# Patient Record
Sex: Female | Born: 1950 | Race: White | Hispanic: No | Marital: Married | Smoking: Never smoker
Health system: Southern US, Community
[De-identification: ages and names within clinical notes are randomized; demographics above are authoritative.]

## PROBLEM LIST (undated history)

## (undated) DIAGNOSIS — G43909 Migraine, unspecified, not intractable, without status migrainosus: Secondary | ICD-10-CM

## (undated) DIAGNOSIS — D329 Benign neoplasm of meninges, unspecified: Secondary | ICD-10-CM

## (undated) DIAGNOSIS — M5090 Cervical disc disorder, unspecified, unspecified cervical region: Secondary | ICD-10-CM

## (undated) DIAGNOSIS — I2510 Atherosclerotic heart disease of native coronary artery without angina pectoris: Secondary | ICD-10-CM

## (undated) DIAGNOSIS — M2242 Chondromalacia patellae, left knee: Secondary | ICD-10-CM

## (undated) DIAGNOSIS — M47816 Spondylosis without myelopathy or radiculopathy, lumbar region: Secondary | ICD-10-CM

## (undated) DIAGNOSIS — K219 Gastro-esophageal reflux disease without esophagitis: Secondary | ICD-10-CM

## (undated) DIAGNOSIS — G4700 Insomnia, unspecified: Secondary | ICD-10-CM

## (undated) DIAGNOSIS — R42 Dizziness and giddiness: Secondary | ICD-10-CM

## (undated) DIAGNOSIS — N189 Chronic kidney disease, unspecified: Secondary | ICD-10-CM

## (undated) DIAGNOSIS — N8110 Cystocele, unspecified: Secondary | ICD-10-CM

## (undated) DIAGNOSIS — B019 Varicella without complication: Secondary | ICD-10-CM

## (undated) DIAGNOSIS — I700 Atherosclerosis of aorta: Secondary | ICD-10-CM

## (undated) DIAGNOSIS — K649 Unspecified hemorrhoids: Secondary | ICD-10-CM

## (undated) DIAGNOSIS — R51 Headache: Secondary | ICD-10-CM

## (undated) DIAGNOSIS — M7580 Other shoulder lesions, unspecified shoulder: Secondary | ICD-10-CM

## (undated) DIAGNOSIS — M519 Unspecified thoracic, thoracolumbar and lumbosacral intervertebral disc disorder: Secondary | ICD-10-CM

## (undated) DIAGNOSIS — R519 Headache, unspecified: Secondary | ICD-10-CM

## (undated) HISTORY — PX: SALPINGECTOMY: SHX328

## (undated) HISTORY — PX: SINUS EXPLORATION: SHX5214

## (undated) HISTORY — PX: ESOPHAGOGASTRODUODENOSCOPY: SHX1529

## (undated) HISTORY — PX: ABDOMINAL HYSTERECTOMY: SHX81

## (undated) HISTORY — PX: OTHER SURGICAL HISTORY: SHX169

## (undated) HISTORY — PX: TUBAL LIGATION: SHX77

---

## 2007-12-19 DIAGNOSIS — D239 Other benign neoplasm of skin, unspecified: Secondary | ICD-10-CM

## 2007-12-19 HISTORY — DX: Other benign neoplasm of skin, unspecified: D23.9

## 2010-04-22 ENCOUNTER — Ambulatory Visit: Payer: Self-pay | Admitting: Otolaryngology

## 2016-07-14 DIAGNOSIS — R519 Headache, unspecified: Secondary | ICD-10-CM | POA: Insufficient documentation

## 2016-07-15 DIAGNOSIS — G43719 Chronic migraine without aura, intractable, without status migrainosus: Secondary | ICD-10-CM | POA: Insufficient documentation

## 2016-07-21 DIAGNOSIS — G4700 Insomnia, unspecified: Secondary | ICD-10-CM | POA: Insufficient documentation

## 2016-07-22 ENCOUNTER — Other Ambulatory Visit: Payer: Self-pay | Admitting: Internal Medicine

## 2016-07-22 DIAGNOSIS — R1084 Generalized abdominal pain: Secondary | ICD-10-CM | POA: Insufficient documentation

## 2016-08-04 ENCOUNTER — Ambulatory Visit
Admission: RE | Admit: 2016-08-04 | Discharge: 2016-08-04 | Disposition: A | Discharge status: Home / Self Care | Payer: BLUE CROSS/BLUE SHIELD | Source: Ambulatory Visit | Attending: Internal Medicine | Admitting: Internal Medicine

## 2016-08-04 DIAGNOSIS — R1084 Generalized abdominal pain: Secondary | ICD-10-CM | POA: Insufficient documentation

## 2016-08-04 DIAGNOSIS — I700 Atherosclerosis of aorta: Secondary | ICD-10-CM | POA: Diagnosis not present

## 2016-08-04 LAB — POCT I-STAT CREATININE: Creatinine, Ser: 1.1 mg/dL — ABNORMAL HIGH (ref 0.44–1.00)

## 2016-08-04 MED ORDER — IOPAMIDOL (ISOVUE-300) INJECTION 61%
100.0000 mL | Freq: Once | INTRAVENOUS | Status: AC | PRN
  Administered 2016-08-04: 100 mL via INTRAVENOUS

## 2016-08-17 ENCOUNTER — Other Ambulatory Visit: Payer: Self-pay | Admitting: Physician Assistant

## 2016-08-17 DIAGNOSIS — M5412 Radiculopathy, cervical region: Secondary | ICD-10-CM

## 2016-12-01 ENCOUNTER — Other Ambulatory Visit: Payer: Self-pay | Admitting: Obstetrics & Gynecology

## 2016-12-01 DIAGNOSIS — Z1231 Encounter for screening mammogram for malignant neoplasm of breast: Secondary | ICD-10-CM

## 2016-12-08 ENCOUNTER — Ambulatory Visit
Admission: RE | Admit: 2016-12-08 | Discharge: 2016-12-08 | Disposition: A | Discharge status: Home / Self Care | Payer: BLUE CROSS/BLUE SHIELD | Source: Ambulatory Visit | Attending: Physician Assistant | Admitting: Physician Assistant

## 2016-12-08 DIAGNOSIS — M50323 Other cervical disc degeneration at C6-C7 level: Secondary | ICD-10-CM | POA: Diagnosis not present

## 2016-12-08 DIAGNOSIS — M4802 Spinal stenosis, cervical region: Secondary | ICD-10-CM | POA: Insufficient documentation

## 2016-12-08 DIAGNOSIS — M5412 Radiculopathy, cervical region: Secondary | ICD-10-CM | POA: Diagnosis present

## 2016-12-13 ENCOUNTER — Encounter: Payer: Self-pay | Admitting: Radiology

## 2016-12-13 ENCOUNTER — Ambulatory Visit
Admission: RE | Admit: 2016-12-13 | Discharge: 2016-12-13 | Disposition: A | Discharge status: Home / Self Care | Payer: BLUE CROSS/BLUE SHIELD | Source: Ambulatory Visit | Attending: Obstetrics & Gynecology | Admitting: Obstetrics & Gynecology

## 2016-12-13 DIAGNOSIS — Z1231 Encounter for screening mammogram for malignant neoplasm of breast: Secondary | ICD-10-CM

## 2016-12-22 ENCOUNTER — Ambulatory Visit: Payer: BLUE CROSS/BLUE SHIELD | Admitting: Anesthesiology

## 2016-12-22 ENCOUNTER — Ambulatory Visit
Admission: RE | Admit: 2016-12-22 | Discharge: 2016-12-22 | Disposition: A | Discharge status: Home / Self Care | Payer: BLUE CROSS/BLUE SHIELD | Source: Ambulatory Visit | Attending: Internal Medicine | Admitting: Internal Medicine

## 2016-12-22 ENCOUNTER — Inpatient Hospital Stay
Admission: RE | Admit: 2016-12-22 | Discharge: 2016-12-22 | Disposition: A | Discharge status: Home / Self Care | Payer: Self-pay | Source: Ambulatory Visit | Attending: *Deleted | Admitting: *Deleted

## 2016-12-22 ENCOUNTER — Encounter: Payer: Self-pay | Admitting: Anesthesiology

## 2016-12-22 ENCOUNTER — Other Ambulatory Visit: Payer: Self-pay | Admitting: *Deleted

## 2016-12-22 ENCOUNTER — Encounter
Admission: RE | Disposition: A | Discharge status: Home / Self Care | Payer: Self-pay | Source: Ambulatory Visit | Attending: Internal Medicine

## 2016-12-22 DIAGNOSIS — Z79899 Other long term (current) drug therapy: Secondary | ICD-10-CM | POA: Diagnosis not present

## 2016-12-22 DIAGNOSIS — Z1211 Encounter for screening for malignant neoplasm of colon: Secondary | ICD-10-CM | POA: Diagnosis present

## 2016-12-22 DIAGNOSIS — Z9289 Personal history of other medical treatment: Secondary | ICD-10-CM

## 2016-12-22 DIAGNOSIS — K5900 Constipation, unspecified: Secondary | ICD-10-CM | POA: Insufficient documentation

## 2016-12-22 DIAGNOSIS — K219 Gastro-esophageal reflux disease without esophagitis: Secondary | ICD-10-CM | POA: Insufficient documentation

## 2016-12-22 DIAGNOSIS — Z888 Allergy status to other drugs, medicaments and biological substances status: Secondary | ICD-10-CM | POA: Insufficient documentation

## 2016-12-22 HISTORY — DX: Migraine, unspecified, not intractable, without status migrainosus: G43.909

## 2016-12-22 HISTORY — DX: Headache: R51

## 2016-12-22 HISTORY — PX: COLONOSCOPY WITH PROPOFOL: SHX5780

## 2016-12-22 HISTORY — DX: Headache, unspecified: R51.9

## 2016-12-22 HISTORY — DX: Cystocele, unspecified: N81.10

## 2016-12-22 SURGERY — COLONOSCOPY WITH PROPOFOL
Anesthesia: General

## 2016-12-22 MED ORDER — PROPOFOL 10 MG/ML IV BOLUS
INTRAVENOUS | Status: DC | PRN
  Administered 2016-12-22: 30 mg via INTRAVENOUS
  Administered 2016-12-22: 50 mg via INTRAVENOUS
  Administered 2016-12-22: 20 mg via INTRAVENOUS

## 2016-12-22 MED ORDER — PROPOFOL 500 MG/50ML IV EMUL
INTRAVENOUS | Status: DC | PRN
  Administered 2016-12-22: 150 ug/kg/min via INTRAVENOUS

## 2016-12-22 MED ORDER — LIDOCAINE HCL (PF) 2 % IJ SOLN
INTRAMUSCULAR | Status: AC
  Filled 2016-12-22: qty 10

## 2016-12-22 MED ORDER — SODIUM CHLORIDE 0.9 % IV SOLN
INTRAVENOUS | Status: DC
  Administered 2016-12-22: 1000 mL via INTRAVENOUS

## 2016-12-22 MED ORDER — GLYCOPYRROLATE 0.2 MG/ML IJ SOLN
INTRAMUSCULAR | Status: AC
Start: 2016-12-22 — End: 2016-12-22
  Filled 2016-12-22: qty 1

## 2016-12-22 MED ORDER — PROPOFOL 500 MG/50ML IV EMUL
INTRAVENOUS | Status: AC
  Filled 2016-12-22: qty 50

## 2016-12-22 MED ORDER — PROPOFOL 10 MG/ML IV BOLUS
INTRAVENOUS | Status: AC
Start: 2016-12-22 — End: 2016-12-22
  Filled 2016-12-22: qty 20

## 2016-12-22 MED ORDER — LIDOCAINE HCL (CARDIAC) 20 MG/ML IV SOLN
INTRAVENOUS | Status: DC | PRN
  Administered 2016-12-22: 50 mg via INTRAVENOUS

## 2016-12-22 NOTE — Anesthesia Postprocedure Evaluation (Signed)
Anesthesia Post Note  Patient: Selena Lesser  Procedure(s) Performed: COLONOSCOPY WITH PROPOFOL (N/A )  Patient location during evaluation: Endoscopy Anesthesia Type: General Level of consciousness: awake and alert and oriented Pain management: pain level controlled Vital Signs Assessment: post-procedure vital signs reviewed and stable Respiratory status: spontaneous breathing, nonlabored ventilation and respiratory function stable Cardiovascular status: blood pressure returned to baseline and stable Postop Assessment: no signs of nausea or vomiting Anesthetic complications: no     Last Vitals:  Vitals:   12/22/16 1410 12/22/16 1419  BP: 106/63 108/71  Pulse: 70 72  Resp: (!) 22 18  Temp:    SpO2: 99% 98%    Last Pain:  Vitals:   12/22/16 1350  TempSrc: Tympanic  PainSc: 0-No pain                 Rilynn Habel

## 2016-12-22 NOTE — Op Note (Signed)
[facility] Gastroenterology Patient Name: Michelle Warren Procedure Date: 12/22/2016 1:16 PM MRN: [id number] Account #: [id number] Date of Birth: 1951-03-17 Admit Type: Outpatient Age: 66 Room: [facility] ENDO ROOM 1 Gender: Female Note Status: Finalized Procedure:            Colonoscopy Indications:          Screening for colorectal malignant neoplasm Providers:            Benay Pike. Alice Reichert MD, MD Referring MD:         Ramonita Lab, MD (Referring MD) Medicines:            Propofol per Anesthesia Complications:        No immediate complications. Procedure:            Pre-Anesthesia Assessment:                       - ASA Grade Assessment: III - A patient with severe                        systemic disease.                       - After reviewing the risks and benefits, the patient                        was deemed in satisfactory condition to undergo the                        procedure.                       After obtaining informed consent, the colonoscope was                        passed under direct vision. Throughout the procedure,                        the patient's blood pressure, pulse, and oxygen                        saturations were monitored continuously. The                        Colonoscope was introduced through the anus and                        advanced to the the cecum, identified by appendiceal                        orifice and ileocecal valve. The quality of the bowel                        preparation was fair. The ileocecal valve, appendiceal                        orifice, and rectum were photographed. Findings:      The perianal and digital rectal examinations were normal.      A moderate amount of semi-liquid stool was found in the sigmoid colon,       making visualization difficult. Lavage of the area was performed using  50 - 200 mL of tap water, resulting in clearance with fair visualization.      The exam was otherwise without  abnormality. Impression:           - Preparation of the colon was fair.                       - Stool in the sigmoid colon.                       - The examination was otherwise normal.                       - No specimens collected. Recommendation:       - Patient has a contact number available for                        emergencies. The signs and symptoms of potential                        delayed complications were discussed with the patient.                        Return to normal activities tomorrow. Written discharge                        instructions were provided to the patient.                       - Resume previous diet.                       - Continue present medications.                       - Repeat colonoscopy in 5 years for screening purposes.                       - Return to GI clinic as previously scheduled.                       - Repeat colonoscopy.                       - The findings and recommendations were discussed with                        the patient.                       - The findings and recommendations were discussed with                        the patient's family. Procedure Code(s):    --- Professional ---                       J5701, Colorectal cancer screening; colonoscopy on                        individual not meeting criteria for high risk Diagnosis Code(s):    --- Professional ---  Z12.11, Encounter for screening for malignant neoplasm                        of colon CPT copyright 2016 American Medical Association. All rights reserved. The codes documented in this report are preliminary and upon coder review may  be revised to meet current compliance requirements. Efrain Sella MD, MD 12/22/2016 1:49:25 PM This report has been signed electronically. Number of Addenda: 0 Note Initiated On: 12/22/2016 1:16 PM Scope Withdrawal Time: 0 hours 7 minutes 56 seconds  Total Procedure Duration: 0 hours 18 minutes 34 seconds        [facility]

## 2016-12-22 NOTE — Transfer of Care (Signed)
Immediate Anesthesia Transfer of Care Note  Patient: Michelle Warren  Procedure(s) Performed: COLONOSCOPY WITH PROPOFOL (N/A )  Patient Location: PACU  Anesthesia Type:General  Level of Consciousness: awake, alert , oriented and patient cooperative  Airway & Oxygen Therapy: Patient Spontanous Breathing and Patient connected to nasal cannula oxygen  Post-op Assessment: Report given to RN and Post -op Vital signs reviewed and stable  Post vital signs: Reviewed and stable  Last Vitals:  Vitals:   12/22/16 1238 12/22/16 1350  BP: 94/69 95/60  Pulse: 74 74  Resp: 16 16  Temp: (!) 36.1 C (!) 36.2 C  SpO2: 98% 100%    Last Pain:  Vitals:   12/22/16 1350  TempSrc: Tympanic  PainSc: 0-No pain         Complications: No apparent anesthesia complications

## 2016-12-22 NOTE — Anesthesia Preprocedure Evaluation (Signed)
Anesthesia Evaluation  Patient identified by MRN, date of birth, ID band Patient awake    Reviewed: Allergy & Precautions, H&P , NPO status , Patient's Chart, lab work & pertinent test results  History of Anesthesia Complications Negative for: history of anesthetic complications  Airway Mallampati: II  TM Distance: >3 FB Neck ROM: full    Dental  (+) Poor Dentition, Chipped   Pulmonary neg pulmonary ROS, neg shortness of breath,           Cardiovascular Exercise Tolerance: Good (-) angina(-) Past MI and (-) DOE negative cardio ROS       Neuro/Psych negative neurological ROS  negative psych ROS   GI/Hepatic negative GI ROS, Neg liver ROS, neg GERD  ,  Endo/Other  negative endocrine ROS  Renal/GU negative Renal ROS  negative genitourinary   Musculoskeletal   Abdominal   Peds  Hematology negative hematology ROS (+)   Anesthesia Other Findings Patient reports new nerve problems in her left arm and neck    Reproductive/Obstetrics negative OB ROS                             Anesthesia Physical Anesthesia Plan  ASA: II  Anesthesia Plan: General   Post-op Pain Management:    Induction: Intravenous  PONV Risk Score and Plan: Propofol infusion  Airway Management Planned: Natural Airway and Nasal Cannula  Additional Equipment:   Intra-op Plan:   Post-operative Plan:   Informed Consent: I have reviewed the patients History and Physical, chart, labs and discussed the procedure including the risks, benefits and alternatives for the proposed anesthesia with the patient or authorized representative who has indicated his/her understanding and acceptance.   Dental Advisory Given  Plan Discussed with: Anesthesiologist, CRNA and Surgeon  Anesthesia Plan Comments: (Patient consented for risks of anesthesia including but not limited to:  - adverse reactions to medications - risk of  intubation if required - damage to teeth, lips or other oral mucosa - sore throat or hoarseness - Damage to heart, brain, lungs or loss of life  Patient voiced understanding.)        Anesthesia Quick Evaluation

## 2016-12-22 NOTE — Anesthesia Procedure Notes (Signed)
Date/Time: 12/22/2016 1:17 PM Performed by: Darlyne Russian Pre-anesthesia Checklist: Patient identified, Emergency Drugs available, Suction available, Patient being monitored and Timeout performed Patient Re-evaluated:Patient Re-evaluated prior to induction Oxygen Delivery Method: Nasal cannula Placement Confirmation: positive ETCO2

## 2016-12-22 NOTE — H&P (Signed)
Outpatient short stay form Pre-procedure 12/22/2016 1:08 PM Michelle Warren [name], M.D.  Primary Physician: Ramonita Lab, M.D.  Reason for visit: Colon cancer screening  History of present illness:  66 y/o female with hx of migraine headache and chronic constipation presents for screening colonoscopy. Patient denies rectal bleeding, abnormal weight loss. She has mild bloating with constipation. She reports feeling nervous today and having a migraine headache.   No current facility-administered medications for this encounter.   Prescriptions Prior to Admission  Medication Sig Dispense Refill Last Dose  . albuterol (VENTOLIN HFA) 108 (90 Base) MCG/ACT inhaler Inhale into the lungs every 6 (six) hours as needed for wheezing or shortness of breath.     . diphenhydrAMINE (BENADRYL) 25 mg capsule Take 25 mg by mouth every 6 (six) hours as needed.     . docusate sodium (COLACE) 100 MG capsule Take 100 mg by mouth 2 (two) times daily.     Marland Kitchen eletriptan (RELPAX) 40 MG tablet Take 40 mg by mouth as needed for migraine or headache. May repeat in 2 hours if headache persists or recurs.     . famotidine (PEPCID) 20 MG tablet Take 20 mg by mouth 2 (two) times daily.     Marland Kitchen gabapentin (NEURONTIN) 800 MG tablet Take 800 mg by mouth 3 (three) times daily.     . hydrocortisone (ANUSOL-HC) 2.5 % rectal cream Place 1 application rectally 2 (two) times daily.     . Melatonin 10 MG CAPS Take by mouth.     . senna (SENOKOT) 8.6 MG tablet Take 1 tablet by mouth daily.     Marland Kitchen topiramate (TOPAMAX) 200 MG tablet Take 200 mg by mouth 2 (two) times daily.     Marland Kitchen zolpidem (AMBIEN) 5 MG tablet Take 5 mg by mouth at bedtime as needed for sleep.        Allergies  Allergen Reactions  . Macrobid [Nitrofurantoin Macrocrystal] Shortness Of Breath     Past Medical History:  Diagnosis Date  . Bladder prolapse, female, acquired   . Headache   . Migraines     Review of systems:   As in HPI. An 8 system ROS was otherwise  negative.    Physical Exam  General appearance: alert, cooperative and appears stated age Resp: clear to auscultation bilaterally Cardio: regular rate and rhythm, S1, S2 normal, no murmur, click, rub or gallop GI: soft, non-tender; bowel sounds normal; no masses,  no organomegaly     Planned procedures: Proceed with colonoscopy. The patient understands the nature of the planned procedure, indications, risks, alternatives and potential complications including but not limited to bleeding, infection, perforation, damage to internal organs and possible oversedation/side effects from anesthesia. The patient agrees and gives consent to proceed.  Please refer to procedure notes for findings, recommendations and patient disposition/instructions.    [name] [name], M.D. Gastroenterology 12/22/2016  1:08 PM

## 2016-12-22 NOTE — Anesthesia Post-op Follow-up Note (Signed)
Anesthesia QCDR form completed.        

## 2016-12-22 NOTE — Interval H&P Note (Signed)
History and Physical Interval Note:  12/22/2016 1:11 PM  Michelle Warren  has presented today for surgery, with the diagnosis of SCREENING  The various methods of treatment have been discussed with the patient and family. After consideration of risks, benefits and other options for treatment, the patient has consented to  Procedure(s): COLONOSCOPY WITH PROPOFOL (N/A) as a surgical intervention .  The patient's history has been reviewed, patient examined, no change in status, stable for surgery.  I have reviewed the patient's chart and labs.  Questions were answered to the patient's satisfaction.     [place], [place]

## 2016-12-23 ENCOUNTER — Encounter: Payer: Self-pay | Admitting: Internal Medicine

## 2017-02-08 ENCOUNTER — Encounter: Payer: Self-pay | Admitting: Emergency Medicine

## 2017-02-08 ENCOUNTER — Emergency Department
Admission: EM | Admit: 2017-02-08 | Discharge: 2017-02-08 | Disposition: A | Discharge status: Home / Self Care | Payer: BLUE CROSS/BLUE SHIELD | Attending: Emergency Medicine | Admitting: Emergency Medicine

## 2017-02-08 DIAGNOSIS — R51 Headache: Secondary | ICD-10-CM

## 2017-02-08 DIAGNOSIS — G44219 Episodic tension-type headache, not intractable: Secondary | ICD-10-CM | POA: Insufficient documentation

## 2017-02-08 DIAGNOSIS — Z79899 Other long term (current) drug therapy: Secondary | ICD-10-CM | POA: Insufficient documentation

## 2017-02-08 DIAGNOSIS — R519 Headache, unspecified: Secondary | ICD-10-CM

## 2017-02-08 MED ORDER — KETOROLAC TROMETHAMINE 30 MG/ML IJ SOLN
30.0000 mg | Freq: Once | INTRAMUSCULAR | Status: DC
  Filled 2017-02-08: qty 1

## 2017-02-08 MED ORDER — ORPHENADRINE CITRATE 30 MG/ML IJ SOLN
60.0000 mg | INTRAMUSCULAR | Status: AC
  Administered 2017-02-08: 60 mg via INTRAMUSCULAR
  Filled 2017-02-08: qty 2

## 2017-02-08 MED ORDER — KETOROLAC TROMETHAMINE 30 MG/ML IJ SOLN
30.0000 mg | Freq: Once | INTRAMUSCULAR | Status: AC
  Administered 2017-02-08: 30 mg via INTRAMUSCULAR

## 2017-02-08 MED ORDER — KETOROLAC TROMETHAMINE 10 MG PO TABS: 10.0000 mg | ORAL_TABLET | Freq: Three times a day (TID) | ORAL | 0 refills | Status: DC

## 2017-02-08 MED ORDER — PROCHLORPERAZINE MALEATE 10 MG PO TABS: 10.0000 mg | ORAL_TABLET | Freq: Three times a day (TID) | ORAL | 0 refills | Status: DC | PRN

## 2017-02-08 MED ORDER — PROCHLORPERAZINE MALEATE 10 MG PO TABS
10.0000 mg | ORAL_TABLET | Freq: Once | ORAL | Status: AC
  Administered 2017-02-08: 10 mg via ORAL
  Filled 2017-02-08: qty 1

## 2017-02-08 MED ORDER — ORPHENADRINE CITRATE ER 100 MG PO TB12: 100.0000 mg | ORAL_TABLET | Freq: Two times a day (BID) | ORAL | 0 refills | Status: DC | PRN

## 2017-02-08 NOTE — ED Provider Notes (Signed)
[facility] Emergency Department Provider Note ____________________________________________  Time seen: 1907  I have reviewed the triage vital signs and the nursing notes.  HISTORY  Chief Complaint  Headache  HPI Michelle Warren is a 66 y.o. female presents to the ED for evaluation of migraine headache.  Patient presents from home with a history of migraines.  She did denies any vomiting but does report some nausea.  She is typically treated with topiramate, eletriptan, and diphenhydramine for migraine relief. She gives a history of cervical spinal stenosis and DDD. She has been seen by [facility] with Botox injections. She reports this type of posterior headache is not her typical migraine. She does note, however, it is not new, different, or concerning. She just was not able to get pain relief at home. She has in the past been treated with trigger point injections to the occiput. She denies any visual change, weakness, syncope, chest pain, or distal paresthesias. She has dosed her daily migraine medications, without relief. She denies injury or trauma.   Past Medical History:  Diagnosis Date  . Bladder prolapse, female, acquired   . Headache   . Migraines     There are no active problems to display for this patient.   Past Surgical History:  Procedure Laterality Date  . ABDOMINAL HYSTERECTOMY    . bladder tack    . COLONOSCOPY WITH PROPOFOL N/A 12/22/2016   Procedure: COLONOSCOPY WITH PROPOFOL;  Surgeon: [place], Benay Pike, MD;  Location: [facility] ENDOSCOPY;  Service: Gastroenterology;  Laterality: N/A;  . SINUS EXPLORATION    . TUBAL LIGATION      Prior to Admission medications   Medication Sig Start Date End Date Taking? Authorizing Provider  albuterol (VENTOLIN HFA) 108 (90 Base) MCG/ACT inhaler Inhale into the lungs every 6 (six) hours as needed for wheezing or shortness of breath.    [provider]  diphenhydrAMINE (BENADRYL) 25 mg capsule Take  25 mg by mouth every 6 (six) hours as needed.    [provider]  docusate sodium (COLACE) 100 MG capsule Take 100 mg by mouth 2 (two) times daily.    [provider]  eletriptan (RELPAX) 40 MG tablet Take 40 mg by mouth as needed for migraine or headache. May repeat in 2 hours if headache persists or recurs.    [provider]  famotidine (PEPCID) 20 MG tablet Take 20 mg by mouth 2 (two) times daily.    [provider]  gabapentin (NEURONTIN) 800 MG tablet Take 800 mg by mouth 3 (three) times daily.    [provider]  hydrocortisone (ANUSOL-HC) 2.5 % rectal cream Place 1 application rectally 2 (two) times daily.    [provider]  ketorolac (TORADOL) 10 MG tablet Take 1 tablet (10 mg total) by mouth every 8 (eight) hours. 02/08/17   Guneet Delpino, Dannielle Karvonen, PA-C  Melatonin 10 MG CAPS Take by mouth.    [provider]  orphenadrine (NORFLEX) 100 MG tablet Take 1 tablet (100 mg total) by mouth 2 (two) times daily as needed for muscle spasms. 02/08/17   Gabriele Loveland, Dannielle Karvonen, PA-C  prochlorperazine (COMPAZINE) 10 MG tablet Take 1 tablet (10 mg total) by mouth every 8 (eight) hours as needed for nausea or vomiting. 02/08/17   Kemisha Bonnette, Dannielle Karvonen, PA-C  senna (SENOKOT) 8.6 MG tablet Take 1 tablet by mouth daily.    [provider]  topiramate (TOPAMAX) 200 MG tablet Take 200 mg by mouth 2 (  two) times daily.    [provider]  zolpidem (AMBIEN) 5 MG tablet Take 5 mg by mouth at bedtime as needed for sleep.    [provider]    Allergies Macrobid [nitrofurantoin macrocrystal]  Family History  Problem Relation Age of Onset  . Breast cancer Neg Hx     Social History Social History   Tobacco Use  . Smoking status: Never Smoker  . Smokeless tobacco: Never Used  Substance Use Topics  . Alcohol use: No    Frequency: Never  . Drug use: No    Review of Systems  Constitutional: Negative for  fever. Eyes: Negative for visual changes. ENT: Negative for sore throat. Cardiovascular: Negative for chest pain. Respiratory: Negative for shortness of breath. Gastrointestinal: Negative for abdominal pain, vomiting and diarrhea. Genitourinary: Negative for dysuria. Musculoskeletal: Negative for back pain. Skin: Negative for rash. Neurological: Negative for focal weakness or numbness. Reports posterior headache as above.  ____________________________________________  PHYSICAL EXAM:  VITAL SIGNS: ED Triage Vitals  Enc Vitals Group     BP 02/08/17 1832 (!) 139/94     Pulse Rate 02/08/17 1832 80     Resp 02/08/17 1832 15     Temp 02/08/17 1832 (!) 97.5 F (36.4 C)     Temp src --      SpO2 02/08/17 1832 100 %     Weight 02/08/17 1832 152 lb (68.9 kg)     Height 02/08/17 1832 5\' 5"  (1.651 m)     Head Circumference --      Peak Flow --      Pain Score 02/08/17 1831 10     Pain Loc --      Pain Edu? --      Excl. in [place]? --     Constitutional: Alert and oriented. Well appearing and in no distress. Head: Normocephalic and atraumatic. Eyes: Conjunctivae are normal. PERRL. Normal extraocular movements Neck: Supple. No thyromegaly. Normal ROM Hematological/Lymphatic/Immunological: No cervical lymphadenopathy. Cardiovascular: Normal rate, regular rhythm. Normal distal pulses. Respiratory: Normal respiratory effort. No wheezes/rales/rhonchi. Musculoskeletal: Nontender with normal range of motion in all extremities.  Neurologic: CN II-XII grossly intact. Normal gait without ataxia. Normal speech and language. No gross focal neurologic deficits are appreciated. Skin:  Skin is warm, dry and intact. No rash noted. Psychiatric: Mood and affect are normal. Patient exhibits appropriate insight and judgment. ____________________________________________  PROCEDURES  Procedures Toradol 30 mg IM Norflex 60 mg IM Compazine 10 mg PO ____________________________________________  INITIAL  IMPRESSION / ASSESSMENT AND PLAN / ED COURSE  Patient with a ED evaluation of acute posterior occipital headache and neck pain.  Patient describes a an atypical headache presentation that is not out of the ordinary for her.  She presents with symptoms that have not responded to her regular migraine medications.  Her exam is overall benign without any acute neuromuscular deficit or any concern for intracranial process, closed head injury, CVA.  Patient reports near resolution of her symptoms following ED medication administration.  She will be discharged with prescriptions for ketorolac, Norflex, and Compazine the dose as directed.  She should follow with primary care provider or neurology for ongoing symptom management.  Return precautions have been reviewed. ___________________________________________  FINAL CLINICAL IMPRESSION(S) / ED DIAGNOSES  Final diagnoses:  Acute nonintractable headache, unspecified headache type  Episodic tension-type headache, not intractable      Lidiya Reise, Dannielle Karvonen, PA-C 02/09/17 0108    Harvest Dark, MD 02/12/17 1444

## 2017-02-08 NOTE — Discharge Instructions (Signed)
Take the prescription meds as directed. Follow-up with Dr. Manuella Ghazi as planned. Return to the ED as needed.

## 2017-02-08 NOTE — ED Triage Notes (Signed)
Patient presents to ED via POV from home with c/o migraine. Hx of same. Patient reports nausea but no vomiting. Tearful in triage.

## 2018-01-11 ENCOUNTER — Other Ambulatory Visit: Payer: Self-pay | Admitting: Obstetrics & Gynecology

## 2018-01-11 DIAGNOSIS — Z1231 Encounter for screening mammogram for malignant neoplasm of breast: Secondary | ICD-10-CM

## 2018-01-13 ENCOUNTER — Ambulatory Visit
Admission: RE | Admit: 2018-01-13 | Discharge: 2018-01-13 | Disposition: A | Discharge status: Home / Self Care | Payer: BLUE CROSS/BLUE SHIELD | Source: Ambulatory Visit | Attending: Obstetrics & Gynecology | Admitting: Obstetrics & Gynecology

## 2018-01-13 DIAGNOSIS — Z1231 Encounter for screening mammogram for malignant neoplasm of breast: Secondary | ICD-10-CM

## 2018-03-17 ENCOUNTER — Other Ambulatory Visit: Payer: Self-pay | Admitting: Internal Medicine

## 2018-03-17 DIAGNOSIS — R51 Headache: Principal | ICD-10-CM

## 2018-03-17 DIAGNOSIS — R519 Headache, unspecified: Secondary | ICD-10-CM

## 2018-04-05 ENCOUNTER — Ambulatory Visit
Admission: RE | Admit: 2018-04-05 | Discharge: 2018-04-05 | Disposition: A | Discharge status: Home / Self Care | Payer: Medicare HMO | Source: Ambulatory Visit | Attending: Internal Medicine | Admitting: Internal Medicine

## 2018-04-05 DIAGNOSIS — R51 Headache: Secondary | ICD-10-CM | POA: Diagnosis not present

## 2018-04-05 DIAGNOSIS — R519 Headache, unspecified: Secondary | ICD-10-CM

## 2018-04-05 MED ORDER — GADOBUTROL 1 MMOL/ML IV SOLN
6.0000 mL | Freq: Once | INTRAVENOUS | Status: AC | PRN
  Administered 2018-04-05: 6 mL via INTRAVENOUS

## 2018-04-06 DIAGNOSIS — D329 Benign neoplasm of meninges, unspecified: Secondary | ICD-10-CM | POA: Insufficient documentation

## 2018-04-19 ENCOUNTER — Encounter: Payer: Self-pay | Admitting: Podiatry

## 2018-04-19 ENCOUNTER — Ambulatory Visit (INDEPENDENT_AMBULATORY_CARE_PROVIDER_SITE_OTHER): Payer: Medicare HMO | Admitting: Podiatry

## 2018-04-19 VITALS — BP 127/75 | HR 67

## 2018-04-19 DIAGNOSIS — L600 Ingrowing nail: Secondary | ICD-10-CM

## 2018-04-19 DIAGNOSIS — M21619 Bunion of unspecified foot: Secondary | ICD-10-CM | POA: Diagnosis not present

## 2018-04-19 MED ORDER — NEOMYCIN-POLYMYXIN-HC 3.5-10000-1 OT SOLN: OTIC | 0 refills | Status: DC

## 2018-04-19 MED ORDER — IBUPROFEN 200 MG PO TABS: 600.0000 mg | ORAL_TABLET | Freq: Three times a day (TID) | ORAL | Status: DC

## 2018-04-19 NOTE — Progress Notes (Signed)
Subjective:   Patient ID: Michelle Warren, female   DOB: 68 y.o.   MRN: [id number]   HPI Patient presents stating she is had an ingrown toenail of her left big toe and also was concerned about bunions and whether not back to be contributory to the pain she experiences.  Patient states is been going on for couple months and she is tried to trim it soak it and has had pedicures without relief and patient does not smoke and likes to be active   Review of Systems  All other systems reviewed and are negative.       Objective:  Physical Exam Vitals signs and nursing note reviewed.  Constitutional:      Appearance: She is well-developed.  Pulmonary:     Effort: Pulmonary effort is normal.  Musculoskeletal: Normal range of motion.  Skin:    General: Skin is warm.  Neurological:     Mental Status: She is alert.     Neurovascular status found to be intact muscle strength is adequate range of motion within normal limits with incurvated left hallux medial border is very tender when pressed and is making shoe gear difficult.  Patient has mild to moderate bunion deformity also noted with slight redness but it appears to be more related to the nail disease than it does to the bunion itself     Assessment:  Ingrown toenail deformity left hallux medial border with pain with no signs of infection with mild structural bunion deformity bilateral     Plan:  H&P discussed both conditions were to focus on the nail.  I discussed nail procedure to fix this and I explained procedure and risk to patient and patient wants surgery understanding risk and signed consent form.  Today I infiltrated the left hallux 60 mg like Marcaine mixture removed the medial border exposed matrix and applied phenol 3 applications 30 seconds followed by alcohol lavage and sterile dressing.  Gave instructions on soaks leaving dressing on 24 hours and to take it off early if it were to throb and also wrote prescription for  antibiotic drops to use

## 2018-04-19 NOTE — Patient Instructions (Signed)

## 2018-04-20 ENCOUNTER — Telehealth: Payer: Self-pay | Admitting: Podiatry

## 2018-04-20 MED ORDER — IBUPROFEN 600 MG PO TABS: 600.0000 mg | ORAL_TABLET | Freq: Three times a day (TID) | ORAL | 0 refills | Status: DC | PRN

## 2018-04-20 NOTE — Telephone Encounter (Signed)
I called pt and informed it would be fine to use the neosporin ointment, we preferred the drops, they have an antiinflammatory property and assist with some decrease of pain. Pt states she is paying $400.00 every month for botox for migraines and she is trying to cut cost. I told pt to use a light coating on neosporin on a fabric bandaid and perform soaks as instructed. Pt states understanding.

## 2018-04-20 NOTE — Telephone Encounter (Signed)
Pt was seen yesterday for ingrown and the medication prescribed is going to cost the pt $80.94 after insurance and she unable purchase. Pt wants to know if there is something else she can have called in that is cheaper. Please give pt a call.

## 2018-04-20 NOTE — Addendum Note (Signed)
Addended by: Harriett Sine D on: 04/20/2018 02:34 PM   Modules accepted: Orders

## 2018-04-24 ENCOUNTER — Ambulatory Visit: Payer: Self-pay | Admitting: Podiatry

## 2018-08-07 DIAGNOSIS — N183 Chronic kidney disease, stage 3 unspecified: Secondary | ICD-10-CM | POA: Insufficient documentation

## 2019-04-10 ENCOUNTER — Ambulatory Visit: Payer: Medicare HMO

## 2019-04-11 ENCOUNTER — Ambulatory Visit: Payer: Medicare Other | Attending: Internal Medicine

## 2019-04-11 DIAGNOSIS — Z23 Encounter for immunization: Secondary | ICD-10-CM | POA: Insufficient documentation

## 2019-04-11 DIAGNOSIS — C4491 Basal cell carcinoma of skin, unspecified: Secondary | ICD-10-CM

## 2019-04-11 HISTORY — DX: Basal cell carcinoma of skin, unspecified: C44.91

## 2019-04-11 NOTE — Progress Notes (Signed)
   Covid-19 Vaccination Clinic  Name:  Michelle Warren    MRN: [id number] DOB: 05/26/1950  04/11/2019  [name] was observed post Covid-19 immunization for 15 minutes without incidence. She was provided with Vaccine Information Sheet and instruction to access the V-Safe system.   [name] was instructed to call 911 with any severe reactions post vaccine: Marland Kitchen Difficulty breathing  . Swelling of your face and throat  . A fast heartbeat  . A bad rash all over your body  . Dizziness and weakness    Immunizations Administered    Name Date Dose VIS Date Route   Pfizer COVID-19 Vaccine 04/11/2019  3:54 PM 0.3 mL 03/02/2019 Intramuscular   Manufacturer: [place]   Lot: [id number]   [place]: [id number]

## 2019-05-02 ENCOUNTER — Ambulatory Visit: Payer: Medicare HMO | Attending: Internal Medicine

## 2019-05-02 DIAGNOSIS — Z23 Encounter for immunization: Secondary | ICD-10-CM | POA: Insufficient documentation

## 2019-05-02 NOTE — Progress Notes (Signed)
   Covid-19 Vaccination Clinic  Name:  Michelle Warren    MRN: [id number] DOB: 1950/07/06  05/02/2019  [name] was observed post Covid-19 immunization for 15 minutes without incidence. She was provided with Vaccine Information Sheet and instruction to access the V-Safe system.   [name] was instructed to call 911 with any severe reactions post vaccine: Marland Kitchen Difficulty breathing  . Swelling of your face and throat  . A fast heartbeat  . A bad rash all over your body  . Dizziness and weakness    Immunizations Administered    Name Date Dose VIS Date Route   Pfizer COVID-19 Vaccine 05/02/2019  3:02 PM 0.3 mL 03/02/2019 Intramuscular   Manufacturer: [place]   Lot: [id number]   [place]: [id number]

## 2019-06-18 ENCOUNTER — Other Ambulatory Visit: Payer: Self-pay | Admitting: Internal Medicine

## 2019-06-18 ENCOUNTER — Other Ambulatory Visit: Payer: Self-pay | Admitting: Obstetrics & Gynecology

## 2019-06-18 DIAGNOSIS — Z1231 Encounter for screening mammogram for malignant neoplasm of breast: Secondary | ICD-10-CM

## 2019-06-26 ENCOUNTER — Ambulatory Visit
Admission: RE | Admit: 2019-06-26 | Discharge: 2019-06-26 | Disposition: A | Discharge status: Home / Self Care | Payer: Medicare HMO | Source: Ambulatory Visit | Attending: Internal Medicine | Admitting: Internal Medicine

## 2019-06-26 DIAGNOSIS — Z1231 Encounter for screening mammogram for malignant neoplasm of breast: Secondary | ICD-10-CM | POA: Insufficient documentation

## 2019-07-09 ENCOUNTER — Other Ambulatory Visit: Payer: Self-pay

## 2019-07-09 ENCOUNTER — Ambulatory Visit: Payer: Medicare HMO | Admitting: Dermatology

## 2019-07-09 DIAGNOSIS — L821 Other seborrheic keratosis: Secondary | ICD-10-CM

## 2019-07-09 DIAGNOSIS — L578 Other skin changes due to chronic exposure to nonionizing radiation: Secondary | ICD-10-CM

## 2019-07-09 DIAGNOSIS — L820 Inflamed seborrheic keratosis: Secondary | ICD-10-CM

## 2019-07-09 DIAGNOSIS — Z85828 Personal history of other malignant neoplasm of skin: Secondary | ICD-10-CM | POA: Diagnosis not present

## 2019-07-09 DIAGNOSIS — L570 Actinic keratosis: Secondary | ICD-10-CM | POA: Diagnosis not present

## 2019-07-09 NOTE — Patient Instructions (Signed)

## 2019-07-09 NOTE — Progress Notes (Signed)
   Follow-Up Visit   Subjective  Christobel Floriano is a 69 y.o. female who presents for the following: Basal Cell Carcinoma (History of BCC of left sup nasolabial lat to nasal ala. She had MOHs with Dr. Eli Phillips 06/04/2019.) and Follow-up (AK follow up of right inf cheek and right of midline upper lip at vermillion border. LN2 x 2 04/11/2019 ).    The following portions of the chart were reviewed this encounter and updated as appropriate: Tobacco  Allergies  Meds  Problems  Med Hx  Surg Hx  Fam Hx      Review of Systems: No other skin or systemic complaints.  Objective  Well appearing patient in no apparent distress; mood and affect are within normal limits.  A focused examination was performed including face. Relevant physical exam findings are noted in the Assessment and Plan.  Objective  Right inf cheek ~3.0 cm lat to oral commissure: Erythematous thin papules/macules with gritty scale.   Objective  Left sup nasolabial lat to nasal ala: Healing excision site with spitting suture.  Objective  Right of midline sup forehead: Erythematous keratotic or waxy stuck-on papule or plaque.   Assessment & Plan    Actinic Damage - diffuse scaly erythematous macules with underlying dyspigmentation - Recommend daily broad spectrum sunscreen SPF 30+ to sun-exposed areas, reapply every 2 hours as needed.  - Call for new or changing lesions.  Seborrheic Keratoses - Stuck-on, waxy, tan-brown papules and plaques  - Discussed benign etiology and prognosis. - Observe - Call for any changes  AK (actinic keratosis) Right inf cheek ~3.0 cm lat to oral commissure  Destruction of lesion - Right inf cheek ~3.0 cm lat to oral commissure Complexity: simple   Destruction method: cryotherapy   Informed consent: discussed and consent obtained   Timeout:  patient name, date of birth, surgical site, and procedure verified Lesion destroyed using liquid nitrogen: Yes   Region frozen until  ice ball extended beyond lesion: Yes   Outcome: patient tolerated procedure well with no complications   Post-procedure details: wound care instructions given    History of basal cell carcinoma (BCC) Left sup nasolabial lat to nasal ala S/P Mohs with Dr Lacinda Axon at [facility] Healing well. Keep follow up with Dr. Lacinda Axon.  Inflamed seborrheic keratosis Right of midline sup forehead  Destruction of lesion - Right of midline sup forehead Complexity: simple   Destruction method: cryotherapy   Informed consent: discussed and consent obtained   Timeout:  patient name, date of birth, surgical site, and procedure verified Lesion destroyed using liquid nitrogen: Yes   Region frozen until ice ball extended beyond lesion: Yes   Outcome: patient tolerated procedure well with no complications   Post-procedure details: wound care instructions given    Return in about 6 weeks (around 08/20/2019).   I, Ashok Cordia, CMA, am acting as scribe for Sarina Ser, MD .  Documentation: I have reviewed the above documentation for accuracy and completeness, and I agree with the above.  Sarina Ser, MD

## 2019-07-10 ENCOUNTER — Encounter: Payer: Self-pay | Admitting: Dermatology

## 2019-08-30 ENCOUNTER — Ambulatory Visit: Payer: Medicare HMO | Admitting: Dermatology

## 2019-08-30 ENCOUNTER — Other Ambulatory Visit: Payer: Self-pay

## 2019-08-30 DIAGNOSIS — L821 Other seborrheic keratosis: Secondary | ICD-10-CM | POA: Diagnosis not present

## 2019-08-30 DIAGNOSIS — L570 Actinic keratosis: Secondary | ICD-10-CM

## 2019-08-30 DIAGNOSIS — L578 Other skin changes due to chronic exposure to nonionizing radiation: Secondary | ICD-10-CM

## 2019-08-30 DIAGNOSIS — Z85828 Personal history of other malignant neoplasm of skin: Secondary | ICD-10-CM

## 2019-08-30 NOTE — Progress Notes (Signed)
   Follow-Up Visit   Subjective  Michelle Warren is a 69 y.o. female who presents for the following: Follow-up.  Patient here today for AK 6 week follow up at R inferior cheek and an ISK at R forehead. There is a spot at forehead and one at nose she would like checked. No symptoms with it.   The following portions of the chart were reviewed this encounter and updated as appropriate:  Tobacco  Allergies  Meds  Problems  Med Hx  Surg Hx  Fam Hx      Review of Systems:  No other skin or systemic complaints except as noted in HPI or Assessment and Plan.  Objective  Well appearing patient in no apparent distress; mood and affect are within normal limits.  A focused examination was performed including face. Relevant physical exam findings are noted in the Assessment and Plan.  Objective  Left superior nasolabial lat to nasal ala: Dyspigmented smooth macule or patch.   Objective  Left Tip of Nose x 1, R cheek x 1 (2): Erythematous thin papules/macules with gritty scale.    Assessment & Plan  History of basal cell carcinoma (BCC) Left superior nasolabial lat to nasal ala  No evidence of recurrence, call clinic for new or changing lesions.  Patient seeing Dr. Lacinda Axon at [facility] s/p Moh's  AK (actinic keratosis) (2) Left Tip of Nose x 1, R cheek x 1  Destruction of lesion - Left Tip of Nose x 1, R cheek x 1 Complexity: simple   Destruction method: cryotherapy   Informed consent: discussed and consent obtained   Timeout:  patient name, date of birth, surgical site, and procedure verified Lesion destroyed using liquid nitrogen: Yes   Region frozen until ice ball extended beyond lesion: Yes   Outcome: patient tolerated procedure well with no complications   Post-procedure details: wound care instructions given     Seborrheic Keratoses - Stuck-on, waxy, tan-brown papules and plaques  - Discussed benign etiology and prognosis. - Observe - Call for any changes  Actinic  Damage - diffuse scaly erythematous macules with underlying dyspigmentation - Recommend daily broad spectrum sunscreen SPF 30+ to sun-exposed areas, reapply every 2 hours as needed.  - Call for new or changing lesions.  Return as scheduled, for TBSE.  Graciella Belton, RMA, am acting as scribe for Sarina Ser, MD .  \Documentation: I have reviewed the above documentation for accuracy and completeness, and I agree with the above.  Sarina Ser, MD

## 2019-08-30 NOTE — Patient Instructions (Addendum)
Recommend daily broad spectrum sunscreen SPF 30+ to sun-exposed areas, reapply every 2 hours as needed. Call for new or changing lesions.  Cryotherapy Aftercare  . Wash gently with soap and water everyday.   . Apply Vaseline and Band-Aid daily until healed.  

## 2019-09-03 ENCOUNTER — Encounter: Payer: Self-pay | Admitting: Dermatology

## 2019-09-17 ENCOUNTER — Other Ambulatory Visit: Payer: Self-pay

## 2019-09-17 ENCOUNTER — Ambulatory Visit
Admission: RE | Admit: 2019-09-17 | Discharge: 2019-09-17 | Disposition: A | Discharge status: Home / Self Care | Payer: Medicare HMO | Source: Ambulatory Visit | Attending: Internal Medicine | Admitting: Internal Medicine

## 2019-09-17 ENCOUNTER — Other Ambulatory Visit: Payer: Self-pay | Admitting: Internal Medicine

## 2019-09-17 ENCOUNTER — Other Ambulatory Visit (HOSPITAL_COMMUNITY): Payer: Self-pay | Admitting: Internal Medicine

## 2019-09-17 DIAGNOSIS — R42 Dizziness and giddiness: Secondary | ICD-10-CM | POA: Insufficient documentation

## 2019-09-17 DIAGNOSIS — S0990XS Unspecified injury of head, sequela: Secondary | ICD-10-CM | POA: Insufficient documentation

## 2019-11-20 ENCOUNTER — Telehealth: Payer: Self-pay | Admitting: Internal Medicine

## 2019-11-20 NOTE — Telephone Encounter (Signed)
Copied from [place] [id number]. Topic: General - Other >> Nov 20, 2019  2:57 PM Leward Quan A wrote: Reason for CRM: Patient called to inquire of the nurse say that in August she was diagnosed with Covid and had an infusion on 11/08/19. She would like to know when she will be safe to have the booster vaccine. Please call Ph# [phone]

## 2019-11-20 NOTE — Telephone Encounter (Signed)
Ret'd call to pt.  Stated she had COVID, and rec'd Infusion Therapy on 11/08/19 through [facility].  Stated she wanted to consider having a booster shot, and asked about the recommended interval that the booster should be given, following Infusion Therapy.  Advised the guideline for pts. that are not immunocompromised, is to wait 8 months after the 2nd dose of the COVID vaccine.   Encouraged her to contact the clinic that she went to for her Infusion Therapy, to obtain guidelines.  Pt. also asked for a number for the [facility] clinic.  Provided # [phone].  Advised pt., she may need to leave a voice message, and that someone would call her back.  Pt. Verb. Understanding.

## 2019-12-17 ENCOUNTER — Other Ambulatory Visit: Payer: Self-pay

## 2019-12-17 ENCOUNTER — Other Ambulatory Visit: Payer: Self-pay | Admitting: Family Medicine

## 2019-12-17 DIAGNOSIS — M5412 Radiculopathy, cervical region: Secondary | ICD-10-CM

## 2019-12-17 DIAGNOSIS — M5030 Other cervical disc degeneration, unspecified cervical region: Secondary | ICD-10-CM

## 2019-12-17 MED ORDER — CICLOPIROX 8 % EX SOLN: Freq: Every day | CUTANEOUS | 3 refills | Status: DC

## 2019-12-17 NOTE — Progress Notes (Signed)
Pt requested to switch pharmacy

## 2020-01-02 ENCOUNTER — Ambulatory Visit
Admission: RE | Admit: 2020-01-02 | Discharge: 2020-01-02 | Disposition: A | Discharge status: Home / Self Care | Payer: Medicare HMO | Source: Ambulatory Visit | Attending: Family Medicine | Admitting: Family Medicine

## 2020-01-02 ENCOUNTER — Other Ambulatory Visit: Payer: Self-pay

## 2020-01-02 DIAGNOSIS — M5412 Radiculopathy, cervical region: Secondary | ICD-10-CM | POA: Insufficient documentation

## 2020-01-02 DIAGNOSIS — M5030 Other cervical disc degeneration, unspecified cervical region: Secondary | ICD-10-CM | POA: Insufficient documentation

## 2020-02-06 ENCOUNTER — Other Ambulatory Visit: Payer: Self-pay

## 2020-02-06 ENCOUNTER — Ambulatory Visit: Payer: Medicare HMO | Admitting: Dermatology

## 2020-02-06 DIAGNOSIS — C4492 Squamous cell carcinoma of skin, unspecified: Secondary | ICD-10-CM

## 2020-02-06 DIAGNOSIS — L821 Other seborrheic keratosis: Secondary | ICD-10-CM

## 2020-02-06 DIAGNOSIS — D229 Melanocytic nevi, unspecified: Secondary | ICD-10-CM

## 2020-02-06 DIAGNOSIS — L814 Other melanin hyperpigmentation: Secondary | ICD-10-CM | POA: Diagnosis not present

## 2020-02-06 DIAGNOSIS — D225 Melanocytic nevi of trunk: Secondary | ICD-10-CM | POA: Diagnosis not present

## 2020-02-06 DIAGNOSIS — C44329 Squamous cell carcinoma of skin of other parts of face: Secondary | ICD-10-CM | POA: Diagnosis not present

## 2020-02-06 DIAGNOSIS — D2339 Other benign neoplasm of skin of other parts of face: Secondary | ICD-10-CM

## 2020-02-06 DIAGNOSIS — Z1283 Encounter for screening for malignant neoplasm of skin: Secondary | ICD-10-CM | POA: Diagnosis not present

## 2020-02-06 DIAGNOSIS — D2330 Other benign neoplasm of skin of unspecified part of face: Secondary | ICD-10-CM

## 2020-02-06 DIAGNOSIS — D1800 Hemangioma unspecified site: Secondary | ICD-10-CM

## 2020-02-06 DIAGNOSIS — L578 Other skin changes due to chronic exposure to nonionizing radiation: Secondary | ICD-10-CM

## 2020-02-06 DIAGNOSIS — D485 Neoplasm of uncertain behavior of skin: Secondary | ICD-10-CM

## 2020-02-06 HISTORY — DX: Squamous cell carcinoma of skin, unspecified: C44.92

## 2020-02-06 NOTE — Patient Instructions (Signed)

## 2020-02-06 NOTE — Progress Notes (Signed)
   Follow-Up Visit   Subjective  Michelle Warren is a 69 y.o. female who presents for the following: Annual Exam (Hx BCC and AK's ). Patient has noticed a lesion on the forehead that is changing, crusts over, and itches.  The patient presents for Total-Body Skin Exam (TBSE) for skin cancer screening and mole check.  The following portions of the chart were reviewed this encounter and updated as appropriate:  Tobacco  Allergies  Meds  Problems  Med Hx  Surg Hx  Fam Hx     Review of Systems:  No other skin or systemic complaints except as noted in HPI or Assessment and Plan.  Objective  Well appearing patient in no apparent distress; mood and affect are within normal limits.  A full examination was performed including scalp, head, eyes, ears, nose, lips, neck, chest, axillae, abdomen, back, buttocks, bilateral upper extremities, bilateral lower extremities, hands, feet, fingers, toes, fingernails, and toenails. All findings within normal limits unless otherwise noted below.  Objective  R glabella: Indented plaque 0.6 cm          Objective  L nose: 0.3cm fibrous papule   Images    Objective  L pubic: 0.5 cm flesh colored papule.  Assessment & Plan  Neoplasm of uncertain behavior of skin R glabella  Skin / nail biopsy Type of biopsy: tangential   Informed consent: discussed and consent obtained   Timeout: patient name, date of birth, surgical site, and procedure verified   Procedure prep:  Patient was prepped and draped in usual sterile fashion Prep type:  Isopropyl alcohol Anesthesia: the lesion was anesthetized in a standard fashion   Anesthetic:  1% lidocaine w/ epinephrine 1-100,000 buffered w/ 8.4% NaHCO3 Instrument used: flexible razor blade   Hemostasis achieved with: pressure, aluminum chloride and electrodesiccation   Outcome: patient tolerated procedure well   Post-procedure details: sterile dressing applied and wound care instructions given     Dressing type: bandage and petrolatum    Specimen 1 - Surgical pathology Differential Diagnosis: D48.5 r/o BCC vs other  Check Margins: No Indented plaque 0.6 cm  Fibrous papule of face L nose Vs nevus - benign appearing, observe. Discussed removal if bothersome.  Nevus L pubic Benign appearing, observe.   Lentigines - Scattered tan macules - Discussed due to sun exposure - Benign, observe - Call for any changes  Seborrheic Keratoses - Stuck-on, waxy, tan-brown papules and plaques  - Discussed benign etiology and prognosis. - Observe - Call for any changes  Melanocytic Nevi - Tan-brown and/or pink-flesh-colored symmetric macules and papules - Benign appearing on exam today - Observation - Call clinic for new or changing moles - Recommend daily use of broad spectrum spf 30+ sunscreen to sun-exposed areas.   Hemangiomas - Red papules - Discussed benign nature - Observe - Call for any changes  Actinic Damage - Chronic, secondary to cumulative UV/sun exposure - diffuse scaly erythematous macules with underlying dyspigmentation - Recommend daily broad spectrum sunscreen SPF 30+ to sun-exposed areas, reapply every 2 hours as needed.  - Call for new or changing lesions.  Skin cancer screening performed today.  Return in about 6 months (around 08/05/2020) for for sun exposed areas; return after January if patient would like to have fibrous papule removed.  Luther Redo, CMA, am acting as scribe for Sarina Ser, MD .  Documentation: I have reviewed the above documentation for accuracy and completeness, and I agree with the above.  Sarina Ser, MD

## 2020-02-11 ENCOUNTER — Encounter: Payer: Self-pay | Admitting: Dermatology

## 2020-02-12 ENCOUNTER — Telehealth: Payer: Self-pay

## 2020-02-12 DIAGNOSIS — C44320 Squamous cell carcinoma of skin of unspecified parts of face: Secondary | ICD-10-CM

## 2020-02-12 NOTE — Telephone Encounter (Signed)
Patient informed of pathology results and referral faxed to Dr. Jonathon Jordan office.

## 2020-02-12 NOTE — Telephone Encounter (Signed)
-----   Message from Ralene Bathe, MD sent at 02/08/2020  4:13 PM EST ----- Diagnosis Skin , right glabella WELL DIFFERENTIATED SQUAMOUS CELL CARCINOMA WITH SUPERFICIAL INFILTRATION  Cancer - SCC Recommend scheduling for MOHS

## 2020-02-19 ENCOUNTER — Telehealth: Payer: Self-pay

## 2020-02-19 MED ORDER — UREA 39 % EX CREA: 1.0000 "application " | TOPICAL_CREAM | Freq: Every day | CUTANEOUS | 5 refills | Status: AC

## 2020-02-19 NOTE — Telephone Encounter (Signed)
Patient called asking for RF of Urea cream, OK to send in?  She would like the Urea 39% sent in to [facility].

## 2020-02-19 NOTE — Telephone Encounter (Signed)
Ok for Urea  - 6 rf

## 2020-02-19 NOTE — Telephone Encounter (Signed)
RX sent in

## 2020-05-14 ENCOUNTER — Ambulatory Visit: Payer: Medicare HMO | Admitting: Dermatology

## 2020-05-14 ENCOUNTER — Other Ambulatory Visit: Payer: Self-pay

## 2020-05-14 ENCOUNTER — Encounter: Payer: Self-pay | Admitting: Dermatology

## 2020-05-14 DIAGNOSIS — D492 Neoplasm of unspecified behavior of bone, soft tissue, and skin: Secondary | ICD-10-CM

## 2020-05-14 DIAGNOSIS — L820 Inflamed seborrheic keratosis: Secondary | ICD-10-CM | POA: Diagnosis not present

## 2020-05-14 DIAGNOSIS — D0439 Carcinoma in situ of skin of other parts of face: Secondary | ICD-10-CM

## 2020-05-14 DIAGNOSIS — D2239 Melanocytic nevi of other parts of face: Secondary | ICD-10-CM

## 2020-05-14 DIAGNOSIS — L578 Other skin changes due to chronic exposure to nonionizing radiation: Secondary | ICD-10-CM | POA: Diagnosis not present

## 2020-05-14 DIAGNOSIS — Z85828 Personal history of other malignant neoplasm of skin: Secondary | ICD-10-CM

## 2020-05-14 NOTE — Patient Instructions (Signed)

## 2020-05-14 NOTE — Progress Notes (Signed)
Follow-Up Visit   Subjective  Michelle Warren is a 70 y.o. female who presents for the following: check spot (R cheek, ), fibrous pap (L nose, pt wants removed), hx of AK R cheek (Txted with LN2 08/30/19, pt states still burning), and hx of BCC (R glabella, MOHs).  The following portions of the chart were reviewed this encounter and updated as appropriate:   Tobacco  Allergies  Meds  Problems  Med Hx  Surg Hx  Fam Hx     Review of Systems:  No other skin or systemic complaints except as noted in HPI or Assessment and Plan.  Objective  Well appearing patient in no apparent distress; mood and affect are within normal limits.  A focused examination was performed including face. Relevant physical exam findings are noted in the Assessment and Plan.  Objective  R glabella: Well healed scar with no evidence of recurrence, no lymphadenopathy.   Objective  L nose: Flesh pap 0.6cm     Objective  Right cheek: 1.0cm pink patch     Objective  L sup nasolabial lat to nasal ala: Well healed scar with no evidence of recurrence.   Objective  R sup zygoma x 1: Erythematous keratotic or waxy stuck-on papule or plaque.    Assessment & Plan    Actinic Damage - chronic, secondary to cumulative UV radiation exposure/sun exposure over time - diffuse scaly erythematous macules with underlying dyspigmentation - Recommend daily broad spectrum sunscreen SPF 30+ to sun-exposed areas, reapply every 2 hours as needed.  - Call for new or changing lesions.  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 Lacinda Axon 04/18/20  Clear. Observe for recurrence. Call clinic for new or changing lesions.  Recommend regular skin exams, daily broad-spectrum spf 30+ sunscreen use, and photoprotection.     Neoplasm of skin (2) L nose  Epidermal / dermal shaving  Lesion diameter (cm):  0.6 Informed consent: discussed and consent obtained   Timeout: patient name, date  of birth, surgical site, and procedure verified   Procedure prep:  Patient was prepped and draped in usual sterile fashion Prep type:  Isopropyl alcohol Anesthesia: the lesion was anesthetized in a standard fashion   Anesthetic:  1% lidocaine w/ epinephrine 1-100,000 buffered w/ 8.4% NaHCO3 Instrument used: flexible razor blade   Hemostasis achieved with: pressure, aluminum chloride and electrodesiccation   Outcome: patient tolerated procedure well   Post-procedure details: sterile dressing applied and wound care instructions given   Dressing type: bandage and petrolatum    Specimen 1 - Surgical pathology Differential Diagnosis: D48.5 Irritated Nevus vs BCC vs Fibrous pap r/o Atypia  Check Margins: No Flesh pap  Right cheek  Epidermal / dermal shaving  Lesion diameter (cm):  1 Informed consent: discussed and consent obtained   Timeout: patient name, date of birth, surgical site, and procedure verified   Procedure prep:  Patient was prepped and draped in usual sterile fashion Prep type:  Isopropyl alcohol Anesthesia: the lesion was anesthetized in a standard fashion   Anesthetic:  1% lidocaine w/ epinephrine 1-100,000 buffered w/ 8.4% NaHCO3 Instrument used: flexible razor blade   Hemostasis achieved with: pressure, aluminum chloride and electrodesiccation   Outcome: patient tolerated procedure well   Post-procedure details: sterile dressing applied and wound care instructions given   Dressing type: bandage and petrolatum    Destruction of lesion Complexity: extensive   Destruction method: electrodesiccation and curettage   Informed consent: discussed and consent obtained  Timeout:  patient name, date of birth, surgical site, and procedure verified Procedure prep:  Patient was prepped and draped in usual sterile fashion Prep type:  Isopropyl alcohol Anesthesia: the lesion was anesthetized in a standard fashion   Anesthetic:  1% lidocaine w/ epinephrine 1-100,000 buffered w/  8.4% NaHCO3 Curettage performed in three different directions: Yes   Electrodesiccation performed over the curetted area: Yes   Lesion length (cm):  1 Lesion width (cm):  1 Margin per side (cm):  0.2 Final wound size (cm):  1.4 Hemostasis achieved with:  pressure, aluminum chloride and electrodesiccation Outcome: patient tolerated procedure well with no complications   Post-procedure details: sterile dressing applied and wound care instructions given   Dressing type: bandage and petrolatum    Specimen 2 - Surgical pathology Differential Diagnosis: D48.5 AK r/o SCC  Check Margins: No 1.0cm pink patch EDC today  Irritated Nevus vs Fibrous Pap r/o Atypia L nose, shave removal today  AK r/o SCC R cheek, shave removal and EDC today  History of basal cell carcinoma (BCC) L sup nasolabial lat to nasal ala  Mohs 06/04/19 Clear. Observe for recurrence. Call clinic for new or changing lesions.  Recommend regular skin exams, daily broad-spectrum spf 30+ sunscreen use, and photoprotection.     Inflamed seborrheic keratosis R sup zygoma x 1  Destruction of lesion - R sup zygoma x 1 Complexity: simple   Destruction method: cryotherapy   Informed consent: discussed and consent obtained   Timeout:  patient name, date of birth, surgical site, and procedure verified Lesion destroyed using liquid nitrogen: Yes   Region frozen until ice ball extended beyond lesion: Yes   Outcome: patient tolerated procedure well with no complications   Post-procedure details: wound care instructions given    Return in about 6 months (around 11/11/2020) for TBSE, Hx of BCC, Hx of SCC, Hx of AKs.   I, Michelle Warren, RMA, am acting as scribe for Michelle Ser, MD .  Documentation: I have reviewed the above documentation for accuracy and completeness, and I agree with the above.  Michelle Ser, MD

## 2020-05-16 DIAGNOSIS — M75111 Incomplete rotator cuff tear or rupture of right shoulder, not specified as traumatic: Secondary | ICD-10-CM | POA: Insufficient documentation

## 2020-05-16 DIAGNOSIS — M7581 Other shoulder lesions, right shoulder: Secondary | ICD-10-CM | POA: Insufficient documentation

## 2020-05-17 ENCOUNTER — Encounter: Payer: Self-pay | Admitting: Dermatology

## 2020-05-19 ENCOUNTER — Other Ambulatory Visit: Payer: Self-pay | Admitting: Surgery

## 2020-05-19 ENCOUNTER — Other Ambulatory Visit (HOSPITAL_COMMUNITY): Payer: Self-pay | Admitting: Surgery

## 2020-05-19 DIAGNOSIS — M7581 Other shoulder lesions, right shoulder: Secondary | ICD-10-CM

## 2020-05-19 DIAGNOSIS — M75121 Complete rotator cuff tear or rupture of right shoulder, not specified as traumatic: Secondary | ICD-10-CM

## 2020-05-20 ENCOUNTER — Telehealth: Payer: Self-pay

## 2020-05-20 NOTE — Telephone Encounter (Signed)
-----   Message from Ralene Bathe, MD sent at 05/17/2020 11:10 AM EST ----- Diagnosis 1. Skin , left nose MELANOCYTIC NEVUS, INTRADERMAL TYPE, BASE INVOLVED 2. 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

## 2020-05-20 NOTE — Telephone Encounter (Signed)
Advised patient of results/hd  

## 2020-05-29 ENCOUNTER — Ambulatory Visit
Admission: RE | Admit: 2020-05-29 | Discharge: 2020-05-29 | Disposition: A | Discharge status: Home / Self Care | Payer: Medicare HMO | Source: Ambulatory Visit | Attending: Surgery | Admitting: Surgery

## 2020-05-29 ENCOUNTER — Other Ambulatory Visit: Payer: Self-pay

## 2020-05-29 DIAGNOSIS — M75121 Complete rotator cuff tear or rupture of right shoulder, not specified as traumatic: Secondary | ICD-10-CM | POA: Diagnosis present

## 2020-05-29 DIAGNOSIS — M7581 Other shoulder lesions, right shoulder: Secondary | ICD-10-CM | POA: Diagnosis present

## 2020-08-04 DIAGNOSIS — M5090 Cervical disc disorder, unspecified, unspecified cervical region: Secondary | ICD-10-CM | POA: Insufficient documentation

## 2020-08-07 ENCOUNTER — Other Ambulatory Visit: Payer: Self-pay

## 2020-08-07 ENCOUNTER — Ambulatory Visit: Payer: Medicare HMO | Admitting: Dermatology

## 2020-08-07 ENCOUNTER — Encounter: Payer: Self-pay | Admitting: Dermatology

## 2020-08-07 DIAGNOSIS — Z85828 Personal history of other malignant neoplasm of skin: Secondary | ICD-10-CM

## 2020-08-07 DIAGNOSIS — L570 Actinic keratosis: Secondary | ICD-10-CM

## 2020-08-07 DIAGNOSIS — L578 Other skin changes due to chronic exposure to nonionizing radiation: Secondary | ICD-10-CM | POA: Diagnosis not present

## 2020-08-07 DIAGNOSIS — Z86007 Personal history of in-situ neoplasm of skin: Secondary | ICD-10-CM | POA: Diagnosis not present

## 2020-08-07 DIAGNOSIS — L820 Inflamed seborrheic keratosis: Secondary | ICD-10-CM

## 2020-08-07 NOTE — Progress Notes (Signed)
Follow-Up Visit   Subjective  Michelle Warren is a 70 y.o. female who presents for the following: Actinic Keratosis (Face, 65m f/u  ) and hx of Skin CA (BCCs, SCCs, check sun exposed areas today).  The following portions of the chart were reviewed this encounter and updated as appropriate:   Tobacco  Allergies  Meds  Problems  Med Hx  Surg Hx  Fam Hx     Review of Systems:  No other skin or systemic complaints except as noted in HPI or Assessment and Plan.  Objective  Well appearing patient in no apparent distress; mood and affect are within normal limits.  A focused examination was performed including face, chest, arms. Relevant physical exam findings are noted in the Assessment and Plan.  Objective  R cheek: Well healed scar with no evidence of recurrence  Objective  R glabella: Well healed scar with no evidence of recurrence, no lymphadenopathy.   Objective  L sup nasolabial lat to nasal ala: Well healed scar with no evidence of recurrence.   Objective  L brow x 4, forehead x 1 (5): Erythematous keratotic or waxy stuck-on papule or plaque.   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 Observe for recurrence. Call clinic for new or changing lesions.  Recommend regular skin exams, daily broad-spectrum spf 30+ sunscreen use, and photoprotection.     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 Observe for recurrence. Call clinic for new or changing lesions.  Recommend regular skin exams, daily broad-spectrum spf 30+ sunscreen use, and photoprotection.     History of basal cell carcinoma (BCC) L sup nasolabial lat to nasal ala Mohs 06/04/2019 Clear. Observe for recurrence. Call clinic for new or changing lesions.  Recommend regular skin exams, daily broad-spectrum spf 30+ sunscreen use, and photoprotection.      Actinic Damage - chronic, secondary to cumulative UV radiation exposure/sun exposure over time - diffuse scaly erythematous macules with underlying dyspigmentation - Recommend daily broad spectrum sunscreen SPF 30+ to sun-exposed areas, reapply every 2 hours as needed.  - Recommend staying in the shade or wearing long sleeves, sun glasses (UVA+UVB protection) and wide brim hats (4-inch brim around the entire circumference of the hat). - Call for new or changing lesions.  Inflamed seborrheic keratosis (5) L brow x 4, forehead x 1 Destruction of lesion - L brow x 4, forehead x 1 Complexity: simple   Destruction method: cryotherapy   Informed consent: discussed and consent obtained   Timeout:  patient name, date of birth, surgical site, and procedure verified Lesion destroyed using liquid nitrogen: Yes   Region frozen until ice ball extended beyond lesion: Yes   Outcome: patient tolerated procedure well with no complications   Post-procedure details: wound care instructions given    AK (actinic keratosis) (5) forehead x 4, chest x 1 Destruction of lesion - forehead x 4, chest x 1 Complexity: simple   Destruction method: cryotherapy   Informed consent: discussed and consent obtained   Timeout:  patient name, date of birth, surgical site, and procedure verified Lesion destroyed using liquid nitrogen: Yes   Region frozen until ice ball extended beyond lesion: Yes   Outcome: patient tolerated procedure well with no complications   Post-procedure details: wound care instructions given    Return for as scheduled for TBS, Hx  of BCC, Hx of SCC, Hx of AKs.  I, [name], RMA, am acting as scribe for Sarina Ser, MD .  Documentation: I have reviewed the above documentation for accuracy and completeness, and I agree with the above.  Sarina Ser, MD

## 2020-08-07 NOTE — Patient Instructions (Signed)

## 2020-08-12 ENCOUNTER — Other Ambulatory Visit: Payer: Self-pay | Admitting: Surgery

## 2020-08-12 DIAGNOSIS — M75121 Complete rotator cuff tear or rupture of right shoulder, not specified as traumatic: Secondary | ICD-10-CM

## 2020-08-12 DIAGNOSIS — M7581 Other shoulder lesions, right shoulder: Secondary | ICD-10-CM

## 2020-08-13 ENCOUNTER — Other Ambulatory Visit: Payer: Self-pay | Admitting: Internal Medicine

## 2020-08-13 ENCOUNTER — Other Ambulatory Visit (HOSPITAL_COMMUNITY): Payer: Self-pay | Admitting: Internal Medicine

## 2020-08-13 DIAGNOSIS — M5116 Intervertebral disc disorders with radiculopathy, lumbar region: Secondary | ICD-10-CM

## 2020-08-15 ENCOUNTER — Encounter: Payer: Self-pay | Admitting: Dermatology

## 2020-08-28 ENCOUNTER — Other Ambulatory Visit: Payer: Self-pay

## 2020-08-28 ENCOUNTER — Ambulatory Visit
Admission: RE | Admit: 2020-08-28 | Discharge: 2020-08-28 | Disposition: A | Discharge status: Home / Self Care | Payer: Medicare HMO | Source: Ambulatory Visit | Attending: Internal Medicine | Admitting: Internal Medicine

## 2020-08-28 DIAGNOSIS — M5116 Intervertebral disc disorders with radiculopathy, lumbar region: Secondary | ICD-10-CM | POA: Insufficient documentation

## 2020-12-01 ENCOUNTER — Ambulatory Visit: Payer: Medicare HMO | Admitting: Dermatology

## 2020-12-01 ENCOUNTER — Other Ambulatory Visit: Payer: Self-pay

## 2020-12-01 DIAGNOSIS — L578 Other skin changes due to chronic exposure to nonionizing radiation: Secondary | ICD-10-CM | POA: Diagnosis not present

## 2020-12-01 DIAGNOSIS — Z85828 Personal history of other malignant neoplasm of skin: Secondary | ICD-10-CM

## 2020-12-01 DIAGNOSIS — L570 Actinic keratosis: Secondary | ICD-10-CM | POA: Diagnosis not present

## 2020-12-01 DIAGNOSIS — L820 Inflamed seborrheic keratosis: Secondary | ICD-10-CM | POA: Diagnosis not present

## 2020-12-01 DIAGNOSIS — D1800 Hemangioma unspecified site: Secondary | ICD-10-CM

## 2020-12-01 DIAGNOSIS — Z86018 Personal history of other benign neoplasm: Secondary | ICD-10-CM | POA: Diagnosis not present

## 2020-12-01 DIAGNOSIS — L821 Other seborrheic keratosis: Secondary | ICD-10-CM

## 2020-12-01 DIAGNOSIS — Z1283 Encounter for screening for malignant neoplasm of skin: Secondary | ICD-10-CM | POA: Diagnosis not present

## 2020-12-01 DIAGNOSIS — D229 Melanocytic nevi, unspecified: Secondary | ICD-10-CM

## 2020-12-01 DIAGNOSIS — L814 Other melanin hyperpigmentation: Secondary | ICD-10-CM

## 2020-12-01 NOTE — Patient Instructions (Signed)

## 2020-12-01 NOTE — Progress Notes (Signed)
Follow-Up Visit   Subjective  Michelle Warren is a 70 y.o. female who presents for the following: Annual Exam (Patient here for full body skin exam and skin cancer screening. 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 There is a spot that looks like a pimple at left thigh that patient noticed about 2 weeks ago.).  The following portions of the chart were reviewed this encounter and updated as appropriate:   Tobacco  Allergies  Meds  Problems  Med Hx  Surg Hx  Fam Hx     Review of Systems:  No other skin or systemic complaints except as noted in HPI or Assessment and Plan.  Objective  Well appearing patient in no apparent distress; mood and affect are within normal limits.  A full examination was performed including scalp, head, eyes, ears, nose, lips, neck, chest, axillae, abdomen, back, buttocks, bilateral upper extremities, bilateral lower extremities, hands, feet, fingers, toes, fingernails, and toenails. All findings within normal limits unless otherwise noted below.  Left Thigh Erythematous keratotic or waxy stuck-on papule or plaque.   right nasal tip x 1 Erythematous thin papules/macules with gritty scale.    Assessment & Plan  Inflamed seborrheic keratosis Left Thigh  Destruction of lesion - Left Thigh Complexity: simple   Destruction method: cryotherapy   Informed consent: discussed and consent obtained   Timeout:  patient name, date of birth, surgical site, and procedure verified Lesion destroyed using liquid nitrogen: Yes   Region frozen until ice ball extended beyond lesion: Yes   Outcome: patient tolerated procedure well with no complications   Post-procedure details: wound care instructions given    AK (actinic keratosis) right nasal tip x 1  Destruction of lesion - right nasal tip x 1 Complexity: simple   Destruction method: cryotherapy   Informed consent: discussed and consent obtained   Timeout:  patient name, date of birth, surgical site, and  procedure verified Lesion destroyed using liquid nitrogen: Yes   Region frozen until ice ball extended beyond lesion: Yes   Outcome: patient tolerated procedure well with no complications   Post-procedure details: wound care instructions given    Skin cancer screening  Lentigines - Scattered tan macules - Due to sun exposure - Benign-appering, observe - Recommend daily broad spectrum sunscreen SPF 30+ to sun-exposed areas, reapply every 2 hours as needed. - Call for any changes  Seborrheic Keratoses - Stuck-on, waxy, tan-brown papules and/or plaques  - Benign-appearing - Discussed benign etiology and prognosis. - Observe - Call for any changes  Melanocytic Nevi - Tan-brown and/or pink-flesh-colored symmetric macules and papules - Benign appearing on exam today - Observation - Call clinic for new or changing moles - Recommend daily use of broad spectrum spf 30+ sunscreen to sun-exposed areas.   Hemangiomas - Red papules - Discussed benign nature - Observe - Call for any changes  Actinic Damage - Chronic condition, secondary to cumulative UV/sun exposure - diffuse scaly erythematous macules with underlying dyspigmentation - Recommend daily broad spectrum sunscreen SPF 30+ to sun-exposed areas, reapply every 2 hours as needed.  - Staying in the shade or wearing long sleeves, sun glasses (UVA+UVB protection) and wide brim hats (4-inch brim around the entire circumference of the hat) are also recommended for sun protection.  - Call for new or changing lesions.  Skin cancer screening performed today.  History of Basal Cell Carcinoma of the Skin - No evidence of recurrence today - Recommend regular full body skin exams - Recommend daily  broad spectrum sunscreen SPF 30+ to sun-exposed areas, reapply every 2 hours as needed.  - Call if any new or changing lesions are noted between office visits  History of Dysplastic Nevi - No evidence of recurrence today - Recommend  regular full body skin exams - Recommend daily broad spectrum sunscreen SPF 30+ to sun-exposed areas, reapply every 2 hours as needed.  - Call if any new or changing lesions are noted between office visits  History of Squamous Cell Carcinoma of the Skin - No evidence of recurrence today - No lymphadenopathy - Recommend regular full body skin exams - Recommend daily broad spectrum sunscreen SPF 30+ to sun-exposed areas, reapply every 2 hours as needed.  - Call if any new or changing lesions are noted between office visits  Return in about 6 months (around 05/31/2021) for sun exposed areas.  Graciella Belton, RMA, am acting as scribe for Sarina Ser, MD . Documentation: I have reviewed the above documentation for accuracy and completeness, and I agree with the above.  Sarina Ser, MD

## 2020-12-03 ENCOUNTER — Other Ambulatory Visit: Payer: Self-pay | Admitting: Internal Medicine

## 2020-12-04 ENCOUNTER — Other Ambulatory Visit: Payer: Self-pay | Admitting: Neurology

## 2020-12-04 ENCOUNTER — Other Ambulatory Visit (HOSPITAL_COMMUNITY): Payer: Self-pay | Admitting: Neurology

## 2020-12-04 DIAGNOSIS — G43719 Chronic migraine without aura, intractable, without status migrainosus: Secondary | ICD-10-CM

## 2020-12-05 ENCOUNTER — Encounter: Payer: Self-pay | Admitting: Dermatology

## 2020-12-08 ENCOUNTER — Other Ambulatory Visit: Payer: Self-pay | Admitting: Obstetrics and Gynecology

## 2020-12-08 DIAGNOSIS — Z1231 Encounter for screening mammogram for malignant neoplasm of breast: Secondary | ICD-10-CM

## 2020-12-15 ENCOUNTER — Other Ambulatory Visit: Payer: Self-pay

## 2020-12-15 ENCOUNTER — Ambulatory Visit
Admission: RE | Admit: 2020-12-15 | Discharge: 2020-12-15 | Disposition: A | Discharge status: Home / Self Care | Payer: Medicare HMO | Source: Ambulatory Visit | Attending: Neurology | Admitting: Neurology

## 2020-12-15 DIAGNOSIS — G43719 Chronic migraine without aura, intractable, without status migrainosus: Secondary | ICD-10-CM | POA: Diagnosis present

## 2020-12-15 MED ORDER — GADOBUTROL 1 MMOL/ML IV SOLN
6.0000 mL | Freq: Once | INTRAVENOUS | Status: AC | PRN
  Administered 2020-12-15: 6 mL via INTRAVENOUS

## 2020-12-18 ENCOUNTER — Other Ambulatory Visit: Payer: Self-pay

## 2020-12-18 ENCOUNTER — Ambulatory Visit
Admission: RE | Admit: 2020-12-18 | Discharge: 2020-12-18 | Disposition: A | Discharge status: Home / Self Care | Payer: Medicare HMO | Source: Ambulatory Visit | Attending: Obstetrics and Gynecology | Admitting: Obstetrics and Gynecology

## 2020-12-18 DIAGNOSIS — Z1231 Encounter for screening mammogram for malignant neoplasm of breast: Secondary | ICD-10-CM | POA: Diagnosis not present

## 2020-12-22 ENCOUNTER — Other Ambulatory Visit: Payer: Self-pay | Admitting: Dermatology

## 2021-01-09 DIAGNOSIS — M2242 Chondromalacia patellae, left knee: Secondary | ICD-10-CM | POA: Insufficient documentation

## 2021-02-18 DIAGNOSIS — M7582 Other shoulder lesions, left shoulder: Secondary | ICD-10-CM | POA: Insufficient documentation

## 2021-03-13 ENCOUNTER — Encounter: Payer: Self-pay | Admitting: *Deleted

## 2021-03-17 ENCOUNTER — Ambulatory Visit: Payer: Medicare HMO | Admitting: Anesthesiology

## 2021-03-17 ENCOUNTER — Encounter: Payer: Self-pay | Admitting: *Deleted

## 2021-03-17 ENCOUNTER — Ambulatory Visit
Admission: RE | Admit: 2021-03-17 | Discharge: 2021-03-17 | Disposition: A | Discharge status: Home / Self Care | Payer: Medicare HMO | Attending: Gastroenterology | Admitting: Gastroenterology

## 2021-03-17 ENCOUNTER — Other Ambulatory Visit: Payer: Self-pay

## 2021-03-17 ENCOUNTER — Encounter
Admission: RE | Disposition: A | Discharge status: Home / Self Care | Payer: Self-pay | Source: Home / Self Care | Attending: Gastroenterology

## 2021-03-17 DIAGNOSIS — R1310 Dysphagia, unspecified: Secondary | ICD-10-CM | POA: Insufficient documentation

## 2021-03-17 DIAGNOSIS — K449 Diaphragmatic hernia without obstruction or gangrene: Secondary | ICD-10-CM | POA: Insufficient documentation

## 2021-03-17 DIAGNOSIS — K5900 Constipation, unspecified: Secondary | ICD-10-CM | POA: Insufficient documentation

## 2021-03-17 HISTORY — DX: Varicella without complication: B01.9

## 2021-03-17 HISTORY — DX: Benign neoplasm of meninges, unspecified: D32.9

## 2021-03-17 HISTORY — DX: Dizziness and giddiness: R42

## 2021-03-17 HISTORY — DX: Unspecified hemorrhoids: K64.9

## 2021-03-17 HISTORY — PX: COLONOSCOPY WITH PROPOFOL: SHX5780

## 2021-03-17 HISTORY — DX: Gastro-esophageal reflux disease without esophagitis: K21.9

## 2021-03-17 HISTORY — PX: ESOPHAGOGASTRODUODENOSCOPY (EGD) WITH PROPOFOL: SHX5813

## 2021-03-17 SURGERY — ESOPHAGOGASTRODUODENOSCOPY (EGD) WITH PROPOFOL
Anesthesia: General

## 2021-03-17 MED ORDER — PROPOFOL 10 MG/ML IV BOLUS
INTRAVENOUS | Status: AC
  Filled 2021-03-17: qty 20

## 2021-03-17 MED ORDER — PROPOFOL 500 MG/50ML IV EMUL
INTRAVENOUS | Status: DC | PRN
  Administered 2021-03-17: 120 ug/kg/min via INTRAVENOUS

## 2021-03-17 MED ORDER — LIDOCAINE HCL (CARDIAC) PF 100 MG/5ML IV SOSY
PREFILLED_SYRINGE | INTRAVENOUS | Status: DC | PRN
  Administered 2021-03-17: 50 mg via INTRAVENOUS

## 2021-03-17 MED ORDER — SODIUM CHLORIDE 0.9 % IV SOLN
INTRAVENOUS | Status: DC
  Administered 2021-03-17: 10:00:00 1000 mL via INTRAVENOUS

## 2021-03-17 MED ORDER — PROPOFOL 500 MG/50ML IV EMUL
INTRAVENOUS | Status: AC
  Filled 2021-03-17: qty 50

## 2021-03-17 MED ORDER — PROPOFOL 10 MG/ML IV BOLUS
INTRAVENOUS | Status: DC | PRN
  Administered 2021-03-17: 80 mg via INTRAVENOUS
  Administered 2021-03-17: 20 mg via INTRAVENOUS

## 2021-03-17 NOTE — Op Note (Signed)
[facility] Patient Name: Michelle Warren Procedure Date: 03/17/2021 9:59 AM MRN: [id number] Account #: [id number] Date of Birth: May 31, 1950 Admit Type: Outpatient Age: 70 Room: [facility] ENDO ROOM 1 Gender: Female Note Status: Finalized Instrument Name: Upper Endoscope [id number] Procedure:             Upper GI endoscopy Indications:           Dysphagia Providers:             Andrey Farmer MD, MD Medicines:             Monitored Anesthesia Care Complications:         No immediate complications. Estimated blood loss:                         Minimal. Procedure:             Pre-Anesthesia Assessment:                        - Prior to the procedure, a History and Physical was                         performed, and patient medications and allergies were                         reviewed. The patient is competent. The risks and                         benefits of the procedure and the sedation options and                         risks were discussed with the patient. All questions                         were answered and informed consent was obtained.                         Patient identification and proposed procedure were                         verified by the physician, the nurse, the                         anesthesiologist, the anesthetist and the technician                         in the pre-procedure area in the endoscopy suite.                         Mental Status Examination: alert and oriented. Airway                         Examination: normal oropharyngeal airway and neck                         mobility. Respiratory Examination: clear to                         auscultation. CV Examination: normal. Prophylactic  Antibiotics: The patient does not require prophylactic                         antibiotics. Prior Anticoagulants: The patient has                         taken no previous anticoagulant or antiplatelet                          agents. ASA Grade Assessment: II - A patient with mild                         systemic disease. After reviewing the risks and                         benefits, the patient was deemed in satisfactory                         condition to undergo the procedure. The anesthesia                         plan was to use monitored anesthesia care (MAC).                         Immediately prior to administration of medications,                         the patient was re-assessed for adequacy to receive                         sedatives. The heart rate, respiratory rate, oxygen                         saturations, blood pressure, adequacy of pulmonary                         ventilation, and response to care were monitored                         throughout the procedure. The physical status of the                         patient was re-assessed after the procedure.                        After obtaining informed consent, the endoscope was                         passed under direct vision. Throughout the procedure,                         the patient's blood pressure, pulse, and oxygen                         saturations were monitored continuously. The Endoscope                         was introduced through the mouth, and advanced to the  second part of duodenum. The upper GI endoscopy was                         accomplished without difficulty. The patient tolerated                         the procedure well. Findings:      A small hiatal hernia was present.      No endoscopic abnormality was evident in the esophagus to explain the       patient's complaint of dysphagia. Biopsies were obtained from the       proximal and distal esophagus with cold forceps for histology of       suspected eosinophilic esophagitis. Estimated blood loss was minimal.      The entire examined stomach was normal.      The examined duodenum was normal. Impression:            -  Small hiatal hernia.                        - No endoscopic esophageal abnormality to explain                         patient's dysphagia. Biopsied.                        - Normal stomach.                        - Normal examined duodenum. Recommendation:        - Await pathology results.                        - Perform a colonoscopy today. Procedure Code(s):     --- Professional ---                        [id number], Esophagogastroduodenoscopy, flexible,                         transoral; with biopsy, single or multiple Diagnosis Code(s):     --- Professional ---                        K44.9, Diaphragmatic hernia without obstruction or                         gangrene                        R13.10, Dysphagia, unspecified CPT copyright 2019 [employer] Medical Association. All rights reserved. The codes documented in this report are preliminary and upon coder review may  be revised to meet current compliance requirements. Andrey Farmer MD, MD 03/17/2021 10:29:00 AM Number of Addenda: 0 Note Initiated On: 03/17/2021 9:59 AM Estimated Blood Loss:  Estimated blood loss was minimal.      [facility]

## 2021-03-17 NOTE — Anesthesia Preprocedure Evaluation (Addendum)
Anesthesia Evaluation  Patient identified by MRN, date of birth, ID band Patient awake    Reviewed: Allergy & Precautions, NPO status , Patient's Chart, lab work & pertinent test results  Airway Mallampati: II  TM Distance: >3 FB Neck ROM: Full    Dental no notable dental hx.    Pulmonary neg pulmonary ROS,    Pulmonary exam normal        Cardiovascular negative cardio ROS Normal cardiovascular exam     Neuro/Psych  Headaches, Anxiety Depression negative psych ROS   GI/Hepatic Neg liver ROS, Bowel prep,GERD  Medicated,  Endo/Other  negative endocrine ROS  Renal/GU negative Renal ROS  negative genitourinary   Musculoskeletal negative musculoskeletal ROS (+)   Abdominal   Peds negative pediatric ROS (+)  Hematology negative hematology ROS (+)   Anesthesia Other Findings Bladder prolapse, female, acquired  Chicken pox    Headache    Hemorrhoids    Meningioma (HCC)    Migraines    Vertigo       Reproductive/Obstetrics negative OB ROS                            Anesthesia Physical Anesthesia Plan  ASA: 2  Anesthesia Plan: General   Post-op Pain Management:    Induction: Intravenous  PONV Risk Score and Plan: 2 and Propofol infusion and TIVA  Airway Management Planned: Natural Airway and Nasal Cannula  Additional Equipment:   Intra-op Plan:   Post-operative Plan:   Informed Consent: I have reviewed the patients History and Physical, chart, labs and discussed the procedure including the risks, benefits and alternatives for the proposed anesthesia with the patient or authorized representative who has indicated his/her understanding and acceptance.     Dental advisory given  Plan Discussed with: CRNA, Anesthesiologist and Surgeon  Anesthesia Plan Comments:         Anesthesia Quick Evaluation

## 2021-03-17 NOTE — Transfer of Care (Signed)
Immediate Anesthesia Transfer of Care Note  Patient: Michelle Warren  Procedure(s) Performed: ESOPHAGOGASTRODUODENOSCOPY (EGD) WITH PROPOFOL COLONOSCOPY WITH PROPOFOL  Patient Location: PACU and Endoscopy Unit  Anesthesia Type:General  Level of Consciousness: drowsy and patient cooperative  Airway & Oxygen Therapy: Patient Spontanous Breathing  Post-op Assessment: Report given to RN and Post -op Vital signs reviewed and stable  Post vital signs: Reviewed and stable  Last Vitals:  Vitals Value Taken Time  BP 98/56 03/17/21 1033  Temp    Pulse 79 03/17/21 1033  Resp 13 03/17/21 1033  SpO2 98 % 03/17/21 1033  Vitals shown include unvalidated device data.  Last Pain:  Vitals:   03/17/21 0946  TempSrc: Temporal  PainSc: 0-No pain         Complications: No notable events documented.

## 2021-03-17 NOTE — H&P (Signed)
Outpatient short stay form Pre-procedure 03/17/2021  Lesly Rubenstein, MD  Primary Physician: Adin Hector, MD  Reason for visit:  Dysphagia/Screening  History of present illness:   70 y/o lady with history of severe constipation after hysterectomy surgery and history of migraines here for colonoscopy and EGD for dysphagia. No blood thinners. No family history of GI malignancies.    Current Facility-Administered Medications:    0.9 %  sodium chloride infusion, , Intravenous, Continuous, Prabhjot Maddux, Hilton Cork, MD, Last Rate: 20 mL/hr at 03/17/21 1006, 1,000 mL at 03/17/21 1006  Facility-Administered Medications Prior to Admission  Medication Dose Route Frequency Provider Last Rate Last Admin   ibuprofen (ADVIL,MOTRIN) tablet 600 mg  600 mg Oral TID Wallene Huh, DPM       Medications Prior to Admission  Medication Sig Dispense Refill Last Dose   Azelastine HCl 137 MCG/SPRAY SOLN Place into the nose 2 (two) times daily as needed.   Past Week   ciclopirox (PENLAC) 8 % solution APPLY TO NAILS AT BEDTIME AS DIRECTED 6.6 mL 11 Past Week   docusate sodium (COLACE) 100 MG capsule Take 100 mg by mouth 2 (two) times daily.   Past Week   eletriptan (RELPAX) 40 MG tablet Take 40 mg by mouth as needed for migraine or headache. May repeat in 2 hours if headache persists or recurs.   03/16/2021   Erenumab-aooe 140 MG/ML SOAJ Inject into the skin every 28 (twenty-eight) days.   Past Week   famotidine (PEPCID) 20 MG tablet Take 20 mg by mouth 2 (two) times daily.   03/16/2021   gabapentin (NEURONTIN) 800 MG tablet Take 800 mg by mouth 3 (three) times daily.   03/16/2021   hydrocortisone (ANUSOL-HC) 2.5 % rectal cream Place 1 application rectally 2 (two) times daily.   Past Week   ketorolac (TORADOL) 10 MG tablet Take 1 tablet (10 mg total) by mouth every 8 (eight) hours. 15 tablet 0 Past Week   lactulose (CHRONULAC) 10 GM/15ML solution Take by mouth daily as needed for mild constipation.   Past  Week   neomycin-polymyxin-hydrocortisone (CORTISPORIN) OTIC solution Apply 1-2 drops to toe after soaking twice a day 10 mL 0 Past Week   pantoprazole (PROTONIX) 40 MG tablet SMARTSIG:1 Tablet(s) By Mouth Morning-Night   03/16/2021   senna (SENOKOT) 8.6 MG tablet Take 1 tablet by mouth daily.   Past Week   sertraline (ZOLOFT) 50 MG tablet Take 50 mg by mouth daily.   03/16/2021   topiramate (TOPAMAX) 200 MG tablet Take 200 mg by mouth 2 (two) times daily.   03/16/2021   Urea 39 % CREA Apply 1 application topically daily. 227 g 5 03/16/2021   Varenicline Tartrate (TYRVAYA) 0.03 MG/ACT SOLN Place into the nose.    at prn   zolpidem (AMBIEN) 5 MG tablet Take 5 mg by mouth at bedtime as needed for sleep.   03/16/2021   albuterol (VENTOLIN HFA) 108 (90 Base) MCG/ACT inhaler Inhale into the lungs every 6 (six) hours as needed for wheezing or shortness of breath.    at prn   cyclobenzaprine (FLEXERIL) 10 MG tablet Take by mouth. (Patient not taking: Reported on 03/17/2021)   Not Taking   diphenhydrAMINE (BENADRYL) 25 mg capsule Take 25 mg by mouth every 6 (six) hours as needed.    at prn   ibuprofen (ADVIL,MOTRIN) 600 MG tablet Take 1 tablet (600 mg total) by mouth every 8 (eight) hours as needed. 30 tablet 0  levocetirizine (XYZAL) 5 MG tablet Take by mouth.      linaclotide (LINZESS) 72 MCG capsule Take 72 mcg by mouth daily before breakfast. (Patient not taking: Reported on 03/17/2021)   Not Taking   meclizine (ANTIVERT) 12.5 MG tablet TAKE 1 2 TABLETS (12.5 25 MG TOTAL) BY MOUTH 3 (THREE) TIMES DAILY AS NEEDED (MOTION SICKNESS)    at prn   Melatonin 10 MG CAPS Take by mouth.      meloxicam (MOBIC) 15 MG tablet Take 15 mg by mouth daily as needed.    at prn   orphenadrine (NORFLEX) 100 MG tablet Take 1 tablet (100 mg total) by mouth 2 (two) times daily as needed for muscle spasms. (Patient not taking: Reported on 03/17/2021) 20 tablet 0 Not Taking   prochlorperazine (COMPAZINE) 10 MG tablet Take 1  tablet (10 mg total) by mouth every 8 (eight) hours as needed for nausea or vomiting. 15 tablet 0  at prn   valACYclovir (VALTREX) 500 MG tablet TAKE 1 TABLET BY MOUTH TWICE DAILY IF NEEDED FOR FLARE    at prn     Allergies  Allergen Reactions   Macrobid [Nitrofurantoin Macrocrystal] Shortness Of Breath   Trazodone Nausea Only     Past Medical History:  Diagnosis Date   Basal cell carcinoma 04/11/2019   L sup nasolabial lat to nasal ala. Nodular. MOHs 06/04/19   Bladder prolapse, female, acquired    Chicken pox    Dysplastic nevus 12/19/2007   Right proximal lat. dorsum finger. Slight atypia, margins involved.    Dysplastic nevus 12/19/2007   Right sup. lat. scapula. Slight to moderate atypia, close to margin.   Dysplastic nevus 07/15/2008   Left lateral breast. Slight atypia, edges free.   Dysplastic nevus 07/15/2008   Right mastoid-postauricular. Slight atypia, edges free.   Dysplastic nevus 07/15/2008   Right top of shoulder, lat. Slight atypia, edges free.   GERD (gastroesophageal reflux disease)    Headache    Hemorrhoids    Meningioma (HCC)    Migraines    Squamous cell carcinoma of skin 02/06/2020   Right glabella. WD SCC with superficial infiltration. MOHs 04/18/20   Squamous cell carcinoma of skin 05/14/2020   Right cheek (in situ), EDC   Vertigo     Review of systems:  Otherwise negative.    Physical Exam  Gen: Alert, oriented. Appears stated age.  HEENT: PERRLA. Lungs: No respiratory distress CV: RRR Abd: soft, benign, no masses Ext: No edema    Planned procedures: Proceed with EGD/colonoscopy. The patient understands the nature of the planned procedure, indications, risks, alternatives and potential complications including but not limited to bleeding, infection, perforation, damage to internal organs and possible oversedation/side effects from anesthesia. The patient agrees and gives consent to proceed.  Please refer to procedure notes for findings,  recommendations and patient disposition/instructions.     Lesly Rubenstein, MD [facility]

## 2021-03-17 NOTE — Anesthesia Postprocedure Evaluation (Signed)
Anesthesia Post Note  Patient: Michelle Warren  Procedure(s) Performed: ESOPHAGOGASTRODUODENOSCOPY (EGD) WITH PROPOFOL COLONOSCOPY WITH PROPOFOL  Patient location during evaluation: Phase II Anesthesia Type: General Level of consciousness: awake and alert, awake and oriented Pain management: pain level controlled Vital Signs Assessment: post-procedure vital signs reviewed and stable Respiratory status: spontaneous breathing, nonlabored ventilation and respiratory function stable Cardiovascular status: blood pressure returned to baseline and stable Postop Assessment: no apparent nausea or vomiting Anesthetic complications: no   No notable events documented.   Last Vitals:  Vitals:   03/17/21 1033 03/17/21 1052  BP: (!) 98/56 116/75  Pulse:    Resp: 16   Temp:    SpO2: 98%     Last Pain:  Vitals:   03/17/21 1052  TempSrc:   PainSc: 0-No pain                 Phill Mutter

## 2021-03-17 NOTE — Op Note (Signed)
[facility] Patient Name: Michelle Warren Procedure Date: 03/17/2021 9:58 AM MRN: [id number] Account #: [id number] Date of Birth: November 04, 1950 Admit Type: Outpatient Age: 70 Room: [facility] ENDO ROOM 1 Gender: Female Note Status: Finalized Instrument Name: Jasper Riling [id number] Procedure:             Colonoscopy Indications:           Constipation Providers:             Andrey Farmer MD, MD Medicines:             Monitored Anesthesia Care Complications:         No immediate complications. Procedure:             Pre-Anesthesia Assessment:                        - Prior to the procedure, a History and Physical was                         performed, and patient medications and allergies were                         reviewed. The patient is competent. The risks and                         benefits of the procedure and the sedation options and                         risks were discussed with the patient. All questions                         were answered and informed consent was obtained.                         Patient identification and proposed procedure were                         verified by the physician, the nurse, the                         anesthesiologist, the anesthetist and the technician                         in the pre-procedure area in the endoscopy suite.                         Mental Status Examination: alert and oriented. Airway                         Examination: normal oropharyngeal airway and neck                         mobility. Respiratory Examination: clear to                         auscultation. CV Examination: normal. Prophylactic                         Antibiotics: The patient does not require prophylactic  antibiotics. Prior Anticoagulants: The patient has                         taken no previous anticoagulant or antiplatelet                         agents. ASA Grade Assessment: II - A patient  with mild                         systemic disease. After reviewing the risks and                         benefits, the patient was deemed in satisfactory                         condition to undergo the procedure. The anesthesia                         plan was to use monitored anesthesia care (MAC).                         Immediately prior to administration of medications,                         the patient was re-assessed for adequacy to receive                         sedatives. The heart rate, respiratory rate, oxygen                         saturations, blood pressure, adequacy of pulmonary                         ventilation, and response to care were monitored                         throughout the procedure. The physical status of the                         patient was re-assessed after the procedure.                        After obtaining informed consent, the colonoscope was                         passed under direct vision. Throughout the procedure,                         the patient's blood pressure, pulse, and oxygen                         saturations were monitored continuously. The                         Colonoscope was introduced through the anus with the                         intention of advancing to the cecum. The scope was  advanced to the rectum before the procedure was                         aborted. Medications were given. The colonoscopy was                         aborted due to poor bowel prep. Findings:      The perianal and digital rectal examinations were normal.      Solid stool was found in the rectum, precluding visualization. Impression:            - The procedure was aborted due to poor bowel prep.                        - Stool in the rectum.                        - No specimens collected. Recommendation:        - Discharge patient to home.                        - Resume previous diet.                        -  Continue present medications.                        - Repeat colonoscopy at the next available appointment                         because the bowel preparation was suboptimal.                        - Return to referring physician as previously                         scheduled. Procedure Code(s):     --- Professional ---                        [phone], 75, Colonoscopy, flexible; diagnostic,                         including collection of specimen(s) by brushing or                         washing, when performed (separate procedure) Diagnosis Code(s):     --- Professional ---                        K59.00, Constipation, unspecified CPT copyright 2019 American Medical Association. All rights reserved. The codes documented in this report are preliminary and upon coder review may  be revised to meet current compliance requirements. Andrey Farmer MD, MD 03/17/2021 10:32:27 AM Number of Addenda: 0 Note Initiated On: 03/17/2021 9:58 AM Total Procedure Duration: 0 hours 1 minute 19 seconds  Estimated Blood Loss:  Estimated blood loss: none.      [facility]

## 2021-03-17 NOTE — Interval H&P Note (Signed)
History and Physical Interval Note:  03/17/2021 10:09 AM  Michelle Warren  has presented today for surgery, with the diagnosis of Chronic constipation (K59.09) Gastroesophageal reflux disease, unspecified whether esophagitis present (K21.9).  The various methods of treatment have been discussed with the patient and family. After consideration of risks, benefits and other options for treatment, the patient has consented to  Procedure(s): ESOPHAGOGASTRODUODENOSCOPY (EGD) WITH PROPOFOL (N/A) COLONOSCOPY WITH PROPOFOL (N/A) as a surgical intervention.  The patient's history has been reviewed, patient examined, no change in status, stable for surgery.  I have reviewed the patient's chart and labs.  Questions were answered to the patient's satisfaction.     Lesly Rubenstein  Ok to proceed with EGD/Colonoscopy

## 2021-03-18 ENCOUNTER — Encounter: Payer: Self-pay | Admitting: Gastroenterology

## 2021-03-18 LAB — SURGICAL PATHOLOGY

## 2021-06-01 ENCOUNTER — Ambulatory Visit: Payer: Medicare HMO | Admitting: Dermatology

## 2021-06-19 ENCOUNTER — Other Ambulatory Visit: Payer: Self-pay | Admitting: Internal Medicine

## 2021-06-19 DIAGNOSIS — R110 Nausea: Secondary | ICD-10-CM

## 2021-06-19 DIAGNOSIS — R1011 Right upper quadrant pain: Secondary | ICD-10-CM

## 2021-06-24 ENCOUNTER — Ambulatory Visit
Admission: RE | Admit: 2021-06-24 | Discharge: 2021-06-24 | Disposition: A | Discharge status: Home / Self Care | Payer: Medicare HMO | Source: Ambulatory Visit | Attending: Internal Medicine | Admitting: Internal Medicine

## 2021-06-24 DIAGNOSIS — R110 Nausea: Secondary | ICD-10-CM | POA: Insufficient documentation

## 2021-06-24 DIAGNOSIS — R1011 Right upper quadrant pain: Secondary | ICD-10-CM | POA: Diagnosis present

## 2021-06-29 ENCOUNTER — Ambulatory Visit: Payer: Medicare HMO | Admitting: Dermatology

## 2021-06-29 DIAGNOSIS — L820 Inflamed seborrheic keratosis: Secondary | ICD-10-CM

## 2021-06-29 DIAGNOSIS — L570 Actinic keratosis: Secondary | ICD-10-CM | POA: Diagnosis not present

## 2021-06-29 DIAGNOSIS — L578 Other skin changes due to chronic exposure to nonionizing radiation: Secondary | ICD-10-CM | POA: Diagnosis not present

## 2021-06-29 DIAGNOSIS — L814 Other melanin hyperpigmentation: Secondary | ICD-10-CM | POA: Diagnosis not present

## 2021-06-29 DIAGNOSIS — I781 Nevus, non-neoplastic: Secondary | ICD-10-CM

## 2021-06-29 DIAGNOSIS — L821 Other seborrheic keratosis: Secondary | ICD-10-CM

## 2021-06-29 MED ORDER — FLUOROURACIL 5 % EX CREA: TOPICAL_CREAM | CUTANEOUS | 0 refills | Status: DC

## 2021-06-29 NOTE — Patient Instructions (Signed)
5-Fluorouracil/Calcipotriene Patient Education  ? ?Actinic keratoses are the dry, red scaly spots on the skin caused by sun damage. A portion of these spots can turn into skin cancer with time, and treating them can help prevent development of skin cancer.  ? ?Treatment of these spots requires removal of the defective skin cells. There are various ways to remove actinic keratoses, including freezing with liquid nitrogen, treatment with creams, or treatment with a blue light procedure in the office.  ? ?5-fluorouracil cream is a topical cream used to treat actinic keratoses. It works by interfering with the growth of abnormal fast-growing skin cells, such as actinic keratoses. These cells peel off and are replaced by healthy ones.  ? ?5-fluorouracil/calcipotriene is a combination of the 5-fluorouracil cream with a vitamin D analog cream called calcipotriene. The calcipotriene alone does not treat actinic keratoses. However, when it is combined with 5-fluorouracil, it helps the 5-fluorouracil treat the actinic keratoses much faster so that the same results can be achieved with a much shorter treatment time. ? ?INSTRUCTIONS FOR 5-FLUOROURACIL/CALCIPOTRIENE CREAM:  ? ?5-fluorouracil/calcipotriene cream typically only needs to be used for 4-7 days. A thin layer should be applied twice a day to the treatment areas recommended by your physician.  ? ?If your physician prescribed you separate tubes of 5-fluourouracil and calcipotriene, apply a thin layer of 5-fluorouracil followed by a thin layer of calcipotriene.  ? ?Avoid contact with your eyes, nostrils, and mouth. Do not use 5-fluorouracil/calcipotriene cream on infected or open wounds.  ? ?You will develop redness, irritation and some crusting at areas where you have pre-cancer damage/actinic keratoses. IF YOU DEVELOP PAIN, BLEEDING, OR SIGNIFICANT CRUSTING, STOP THE TREATMENT EARLY - you have already gotten a good response and the actinic keratoses should clear up  well. ? ?Wash your hands after applying 5-fluorouracil 5% cream on your skin.  ? ?A moisturizer or sunscreen with a minimum SPF 30 should be applied each morning.  ? ?Once you have finished the treatment, you can apply a thin layer of Vaseline twice a day to irritated areas to soothe and calm the areas more quickly. If you experience significant discomfort, contact your physician. ? ?For some patients it is necessary to repeat the treatment for best results. ? ?SIDE EFFECTS: When using 5-fluorouracil/calcipotriene cream, you may have mild irritation, such as redness, dryness, swelling, or a mild burning sensation. This usually resolves within 2 weeks. The more actinic keratoses you have, the more redness and inflammation you can expect during treatment. Eye irritation has been reported rarely. If this occurs, please let us know.  ? ?If you have any trouble using this cream, please call the office. If you have any other questions about this information, please do not hesitate to ask me before you leave the office. ? ?5-fluorouracil/calcipotriene cream is is a type of field treatment used to treat precancers, thin skin cancers, and areas of sun damage. Expected reaction includes irritation and mild inflammation potentially progressing to more severe inflammation including redness, scaling, crusting and open sores/erosions.  If too much irritation occurs, ensure application of only a thin layer and decrease frequency of use to achieve a tolerable level of inflammation. Recommend applying Vaseline ointment to open sores as needed.  Minimize sun exposure while under treatment. Recommend daily broad spectrum sunscreen SPF 30+ to sun-exposed areas, reapply every 2 hours as needed.  ? ?Start 5-fluorouracil/calcipotriene cream twice a day for 7 days to affected areas including the upper lip. Prescription sent to [facility]. Patient  provided with contact information for pharmacy and advised the pharmacy will mail the  prescription to their home. Patient provided with handout reviewing treatment course and side effects and advised to call or message [place] on MyChart with any concerns.  ? ? ? ?If You Need Anything After Your Visit ? ?If you have any questions or concerns for your doctor, please call our main line at [phone] and press option 4 to reach your doctor's medical assistant. If no one answers, please leave a voicemail as directed and we will return your call as soon as possible. Messages left after 4 pm will be answered the following business day.  ? ?You may also send [place] a message via MyChart. We typically respond to MyChart messages within 1-2 business days. ? ?For prescription refills, please ask your pharmacy to contact our office. Our fax number is [phone]. ? ?If you have an urgent issue when the clinic is closed that cannot wait until the next business day, you can page your doctor at the number below.   ? ?Please note that while we do our best to be available for urgent issues outside of office hours, we are not available 24/7.  ? ?If you have an urgent issue and are unable to reach [place], you may choose to seek medical care at your doctor's office, retail clinic, urgent care center, or emergency room. ? ?If you have a medical emergency, please immediately call 911 or go to the emergency department. ? ?Pager Numbers ? ?- Dr. Nehemiah Massed: [phone] ? ?- Dr. Laurence Ferrari: [phone] ? ?- Dr. Nicole Kindred: [phone] ? ?In the event of inclement weather, please call our main line at [phone] for an update on the status of any delays or closures. ? ?Dermatology Medication Tips: ?Please keep the boxes that topical medications come in in order to help keep track of the instructions about where and how to use these. Pharmacies typically print the medication instructions only on the boxes and not directly on the medication tubes.  ? ?If your medication is too expensive, please contact our office at [phone] option 4 or  send [place] a message through [place].  ? ?We are unable to tell what your co-pay for medications will be in advance as this is different depending on your insurance coverage. However, we may be able to find a substitute medication at lower cost or fill out paperwork to get insurance to cover a needed medication.  ? ?If a prior authorization is required to get your medication covered by your insurance company, please allow [place] 1-2 business days to complete this process. ? ?Drug prices often vary depending on where the prescription is filled and some pharmacies may offer cheaper prices. ? ?The website www.goodrx.com contains coupons for medications through different pharmacies. The prices here do not account for what the cost may be with help from insurance (it may be cheaper with your insurance), but the website can give you the price if you did not use any insurance.  ?- You can print the associated coupon and take it with your prescription to the pharmacy.  ?- You may also stop by our office during regular business hours and pick up a GoodRx coupon card.  ?- If you need your prescription sent electronically to a different pharmacy, notify our office through [place] or by phone at [phone] option 4. ? ? ? ? ?Si Usted Necesita Algo Despu?s de Su Visita ? ?Tambi?n puede enviarnos un mensaje a trav?s de MyChart. Por lo  general respondemos a los mensajes de MyChart en el transcurso de 1 a 2 d?as h?biles. ? ?Para renovar recetas, por favor pida a su farmacia que se ponga en contacto con nuestra oficina. Nuestro n?mero de fax es el [phone]. ? ?Si tiene un asunto urgente cuando la cl?nica est? cerrada y que no puede esperar hasta el siguiente d?a h?bil, puede llamar/localizar a su doctor(a) al n?mero que aparece a continuaci?n.  ? ?Por favor, tenga en cuenta que aunque hacemos todo lo posible para estar disponibles para asuntos urgentes fuera del horario de oficina, no estamos disponibles las 24 horas del  d?a, los 7 d?as de la semana.  ? ?Si tiene un problema urgente y no puede comunicarse con nosotros, puede optar por buscar atenci?n m?dica  en el consultorio de su doctor(a), en una cl?nica privada, en un c

## 2021-06-29 NOTE — Progress Notes (Signed)
? ?Follow-Up Visit ?  ?Subjective  ?Michelle Warren is a 71 y.o. female who presents for the following: Actinic Keratosis (Pt is here today to recheck the R nasal tip for persistent precancerous lesion. ). The patient has spots, moles and lesions to be evaluated, some may be new or changing. ? ?The following portions of the chart were reviewed this encounter and updated as appropriate:  ? Tobacco  Allergies  Meds  Problems  Med Hx  Surg Hx  Fam Hx   ?  ?Review of Systems:  No other skin or systemic complaints except as noted in HPI or Assessment and Plan. ? ?Objective  ?Well appearing patient in no apparent distress; mood and affect are within normal limits. ? ?A focused examination was performed including the face. Relevant physical exam findings are noted in the Assessment and Plan. ? ?Nasal tip ?Dilated vessel. ? ?L med canthus x 1, upper lip of the cupid's bow x 2 (3) ?Erythematous thin papules/macules with gritty scale.  ? ?L prox mandible x 1, R abdomen x 1 (2) ?Erythematous stuck-on, waxy papule or plaque ? ? 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  Typically we recommend 1-3 treatment sessions about 5-8 weeks apart for best results.  The patient's condition may require "maintenance treatments" in the future.  The fee for BBL / laser treatments is $350 per treatment session for the whole face.  A fee can be quoted for other parts of the body. ?Insurance typically does not pay for BBL/laser treatments and therefore the fee is an out-of-pocket cost. ?Benign-appearing.  Observation.  Call clinic for new or changing lesions.  Recommend daily use of broad spectrum spf 30+ sunscreen to sun-exposed areas.  ? ?AK (actinic keratosis) (3) ?L med canthus x 1, upper lip of the cupid's bow x 2 ?In one month start 5FU/Calcipotriene mix BID x 7 days - spot treat areas  ?Destruction of lesion - L med canthus x 1, upper lip of the cupid's bow x 2 ?Complexity: simple    ?Destruction method: cryotherapy   ?Informed consent: discussed and consent obtained   ?Timeout:  patient name, date of birth, surgical site, and procedure verified ?Lesion destroyed using liquid nitrogen: Yes   ?Region frozen until ice ball extended beyond lesion: Yes   ?Outcome: patient tolerated procedure well with no complications   ?Post-procedure details: wound care instructions given   ? ?fluorouracil (EFUDEX) 5 % cream - L med canthus x 1, upper lip of the cupid's bow x 2 ?In one month (07/27/21) apply to aa's upper lip BID x 7 days. ? ?Inflamed seborrheic keratosis (2) ?L prox mandible x 1, R abdomen x 1 ?Destruction of lesion - L prox mandible x 1, R abdomen x 1 ?Complexity: simple   ?Destruction method: cryotherapy   ?Informed consent: discussed and consent obtained   ?Timeout:  patient name, date of birth, surgical site, and procedure verified ?Lesion destroyed using liquid nitrogen: Yes   ?Region frozen until ice ball extended beyond lesion: Yes   ?Outcome: patient tolerated procedure well with no complications   ?Post-procedure details: wound care instructions given   ? ?Actinic Damage - Severe, confluent actinic changes with pre-cancerous actinic keratoses  ?- Severe, chronic, not at goal, secondary to cumulative UV radiation exposure over time ?- diffuse scaly erythematous macules and papules with underlying dyspigmentation ?- Discussed Prescription "Field Treatment" for Severe, Chronic Confluent Actinic Changes with Pre-Cancerous Actinic Keratoses ?Field treatment involves treatment of an  entire area of skin that has confluent Actinic Changes (Sun/ Ultraviolet light damage) and PreCancerous Actinic Keratoses by method of PhotoDynamic Therapy (PDT) and/or prescription Topical Chemotherapy agents such as 5-fluorouracil, 5-fluorouracil/calcipotriene, and/or imiquimod.  The purpose is to decrease the number of clinically evident and subclinical PreCancerous lesions to prevent progression to  development of skin cancer by chemically destroying early precancer changes that may or may not be visible.  It has been shown to reduce the risk of developing skin cancer in the treated area. As a result of treatment, redness, scaling, crusting, and open sores may occur during treatment course. One or more than one of these methods may be used and may have to be used several times to control, suppress and eliminate the PreCancerous changes. Discussed treatment course, expected reaction, and possible side effects. ?- Recommend daily broad spectrum sunscreen SPF 30+ to sun-exposed areas, reapply every 2 hours as needed.  ?- Staying in the shade or wearing long sleeves, sun glasses (UVA+UVB protection) and wide brim hats (4-inch brim around the entire circumference of the hat) are also recommended. ?- Call for new or changing lesions. ?- In one month start 5FU/Calcipotriene to areas on the upper lip BID x 7 days.  ? ?Seborrheic Keratoses ?- Stuck-on, waxy, tan-brown papules and/or plaques  ?- Benign-appearing ?- Discussed benign etiology and prognosis. ?- Observe ?- Call for any changes ? ?Lentigines ?- Scattered tan macules ?- Due to sun exposure ?- Benign-appering, observe ?- Recommend daily broad spectrum sunscreen SPF 30+ to sun-exposed areas, reapply every 2 hours as needed. ?- Call for any changes ? ?Return in about 6 months (around 12/29/2021) for TBSE. ? ?I, Rudell Cobb, CMA, am acting as scribe for Sarina Ser, MD . ?Documentation: I have reviewed the above documentation for accuracy and completeness, and I agree with the above. ? ?Sarina Ser, MD ? ?

## 2021-06-30 ENCOUNTER — Encounter: Payer: Self-pay | Admitting: Dermatology

## 2021-06-30 DIAGNOSIS — I700 Atherosclerosis of aorta: Secondary | ICD-10-CM | POA: Insufficient documentation

## 2021-07-09 ENCOUNTER — Other Ambulatory Visit: Payer: Self-pay | Admitting: Internal Medicine

## 2021-07-09 DIAGNOSIS — K7689 Other specified diseases of liver: Secondary | ICD-10-CM

## 2021-07-28 ENCOUNTER — Ambulatory Visit
Admission: RE | Admit: 2021-07-28 | Discharge: 2021-07-28 | Disposition: A | Discharge status: Home / Self Care | Payer: Medicare HMO | Source: Ambulatory Visit | Attending: Internal Medicine | Admitting: Internal Medicine

## 2021-07-28 DIAGNOSIS — K7689 Other specified diseases of liver: Secondary | ICD-10-CM

## 2021-07-28 MED ORDER — GADOBENATE DIMEGLUMINE 529 MG/ML IV SOLN
13.0000 mL | Freq: Once | INTRAVENOUS | Status: AC | PRN
Start: 2021-07-28 — End: 2021-07-28
  Administered 2021-07-28: 13 mL via INTRAVENOUS

## 2021-08-19 ENCOUNTER — Encounter: Payer: Self-pay | Admitting: Gastroenterology

## 2021-08-20 ENCOUNTER — Ambulatory Visit: Payer: Medicare HMO | Admitting: Anesthesiology

## 2021-08-20 ENCOUNTER — Encounter
Admission: RE | Disposition: A | Discharge status: Home / Self Care | Payer: Self-pay | Source: Home / Self Care | Attending: Gastroenterology

## 2021-08-20 ENCOUNTER — Ambulatory Visit
Admission: RE | Admit: 2021-08-20 | Discharge: 2021-08-20 | Disposition: A | Discharge status: Home / Self Care | Payer: Medicare HMO | Attending: Gastroenterology | Admitting: Gastroenterology

## 2021-08-20 ENCOUNTER — Encounter: Payer: Self-pay | Admitting: Gastroenterology

## 2021-08-20 DIAGNOSIS — K449 Diaphragmatic hernia without obstruction or gangrene: Secondary | ICD-10-CM | POA: Insufficient documentation

## 2021-08-20 DIAGNOSIS — Z8619 Personal history of other infectious and parasitic diseases: Secondary | ICD-10-CM | POA: Diagnosis not present

## 2021-08-20 DIAGNOSIS — Z85828 Personal history of other malignant neoplasm of skin: Secondary | ICD-10-CM | POA: Diagnosis not present

## 2021-08-20 DIAGNOSIS — K641 Second degree hemorrhoids: Secondary | ICD-10-CM | POA: Insufficient documentation

## 2021-08-20 DIAGNOSIS — Z1211 Encounter for screening for malignant neoplasm of colon: Secondary | ICD-10-CM | POA: Diagnosis present

## 2021-08-20 DIAGNOSIS — K200 Eosinophilic esophagitis: Secondary | ICD-10-CM | POA: Diagnosis not present

## 2021-08-20 DIAGNOSIS — Z90710 Acquired absence of both cervix and uterus: Secondary | ICD-10-CM | POA: Insufficient documentation

## 2021-08-20 DIAGNOSIS — K2970 Gastritis, unspecified, without bleeding: Secondary | ICD-10-CM | POA: Insufficient documentation

## 2021-08-20 DIAGNOSIS — Z79899 Other long term (current) drug therapy: Secondary | ICD-10-CM | POA: Insufficient documentation

## 2021-08-20 DIAGNOSIS — R1310 Dysphagia, unspecified: Secondary | ICD-10-CM | POA: Diagnosis not present

## 2021-08-20 DIAGNOSIS — K219 Gastro-esophageal reflux disease without esophagitis: Secondary | ICD-10-CM | POA: Diagnosis not present

## 2021-08-20 DIAGNOSIS — Z86018 Personal history of other benign neoplasm: Secondary | ICD-10-CM | POA: Diagnosis not present

## 2021-08-20 HISTORY — PX: ESOPHAGOGASTRODUODENOSCOPY (EGD) WITH PROPOFOL: SHX5813

## 2021-08-20 HISTORY — PX: COLONOSCOPY WITH PROPOFOL: SHX5780

## 2021-08-20 SURGERY — COLONOSCOPY WITH PROPOFOL
Anesthesia: General

## 2021-08-20 MED ORDER — PROPOFOL 10 MG/ML IV BOLUS
INTRAVENOUS | Status: DC | PRN
  Administered 2021-08-20: 50 mg via INTRAVENOUS

## 2021-08-20 MED ORDER — SODIUM CHLORIDE 0.9 % IV SOLN: INTRAVENOUS | Status: DC

## 2021-08-20 MED ORDER — PROPOFOL 500 MG/50ML IV EMUL
INTRAVENOUS | Status: DC | PRN
  Administered 2021-08-20: 200 ug/kg/min via INTRAVENOUS

## 2021-08-20 NOTE — Anesthesia Procedure Notes (Signed)
Date/Time: 08/20/2021 8:40 AM Performed by: Nelda Marseille, CRNA Pre-anesthesia Checklist: Patient identified, Emergency Drugs available, Suction available, Patient being monitored and Timeout performed Oxygen Delivery Method: Nasal cannula

## 2021-08-20 NOTE — Progress Notes (Signed)
Dr Keane Police office nurse, Suanne Marker, spoke with the patient regarding returning to Endoscopy on 08-21-21 for a repeat Colonoscopy with Dr Virgina Jock due to poor colon prep 08-20-21  Suanne Marker reviewed in detail how to take the SuTabs for an effective colon cleanse and stressed the need for plenty of clear liquids while taking the tablets.  The patient verbalized understanding and will call later today for her arrival time tomorrow.

## 2021-08-20 NOTE — Anesthesia Preprocedure Evaluation (Signed)
Anesthesia Evaluation  Patient identified by MRN, date of birth, ID band Patient awake    Reviewed: Allergy & Precautions, NPO status , Patient's Chart, lab work & pertinent test results  History of Anesthesia Complications Negative for: history of anesthetic complications  Airway Mallampati: III  TM Distance: <3 FB Neck ROM: full    Dental  (+) Chipped   Pulmonary neg pulmonary ROS, neg shortness of breath,    Pulmonary exam normal        Cardiovascular Exercise Tolerance: Good (-) angina(-) Past MI negative cardio ROS Normal cardiovascular exam     Neuro/Psych  Headaches, negative psych ROS   GI/Hepatic Neg liver ROS, GERD  Controlled,  Endo/Other  negative endocrine ROS  Renal/GU negative Renal ROS  negative genitourinary   Musculoskeletal   Abdominal   Peds  Hematology negative hematology ROS (+)   Anesthesia Other Findings Past Medical History: 04/11/2019: Basal cell carcinoma     Comment:  L sup nasolabial lat to nasal ala. Nodular. MOHs               06/04/19 No date: Bladder prolapse, female, acquired No date: Chicken pox 12/19/2007: Dysplastic nevus     Comment:  Right proximal lat. dorsum finger. Slight atypia,               margins involved.  12/19/2007: Dysplastic nevus     Comment:  Right sup. lat. scapula. Slight to moderate atypia,               close to margin. 07/15/2008: Dysplastic nevus     Comment:  Left lateral breast. Slight atypia, edges free. 07/15/2008: Dysplastic nevus     Comment:  Right mastoid-postauricular. Slight atypia, edges free. 07/15/2008: Dysplastic nevus     Comment:  Right top of shoulder, lat. Slight atypia, edges free. No date: GERD (gastroesophageal reflux disease) No date: Headache No date: Hemorrhoids No date: Meningioma [facility]) No date: Migraines 02/06/2020: Squamous cell carcinoma of skin     Comment:  Right glabella. WD SCC with superficial infiltration.                MOHs 04/18/20 05/14/2020: Squamous cell carcinoma of skin     Comment:  Right cheek (in situ), EDC No date: Vertigo  Past Surgical History: No date: ABDOMINAL HYSTERECTOMY No date: bladder tack 12/22/2016: COLONOSCOPY WITH PROPOFOL; N/A     Comment:  Procedure: COLONOSCOPY WITH PROPOFOL;  Surgeon: [place],               Benay Pike, MD;  Location: [facility] ENDOSCOPY;  Service:               Gastroenterology;  Laterality: N/A; 03/17/2021: COLONOSCOPY WITH PROPOFOL; N/A     Comment:  Procedure: COLONOSCOPY WITH PROPOFOL;  Surgeon:               Lesly Rubenstein, MD;  Location: [facility] ENDOSCOPY;                Service: Endoscopy;  Laterality: N/A; No date: ESOPHAGOGASTRODUODENOSCOPY 03/17/2021: ESOPHAGOGASTRODUODENOSCOPY (EGD) WITH PROPOFOL; N/A     Comment:  Procedure: ESOPHAGOGASTRODUODENOSCOPY (EGD) WITH               PROPOFOL;  Surgeon: Lesly Rubenstein, MD;  Location:               [facility] ENDOSCOPY;  Service: Endoscopy;  Laterality: N/A; No date: repair enterocele vaginal No date: SALPINGECTOMY; Bilateral No date: SINUS EXPLORATION No date: TUBAL  LIGATION  BMI    Body Mass Index: 22.66 kg/m      Reproductive/Obstetrics negative OB ROS                             Anesthesia Physical Anesthesia Plan  ASA: 2  Anesthesia Plan: General   Post-op Pain Management:    Induction: Intravenous  PONV Risk Score and Plan: Propofol infusion and TIVA  Airway Management Planned: Natural Airway and Nasal Cannula  Additional Equipment:   Intra-op Plan:   Post-operative Plan:   Informed Consent: I have reviewed the patients History and Physical, chart, labs and discussed the procedure including the risks, benefits and alternatives for the proposed anesthesia with the patient or authorized representative who has indicated his/her understanding and acceptance.     Dental Advisory Given  Plan Discussed with: Anesthesiologist, CRNA and  Surgeon  Anesthesia Plan Comments: (Patient consented for risks of anesthesia including but not limited to:  - adverse reactions to medications - risk of airway placement if required - damage to eyes, teeth, lips or other oral mucosa - nerve damage due to positioning  - sore throat or hoarseness - Damage to heart, brain, nerves, lungs, other parts of body or loss of life  Patient voiced understanding.)        Anesthesia Quick Evaluation

## 2021-08-20 NOTE — Anesthesia Postprocedure Evaluation (Signed)
Anesthesia Post Note  Patient: Michelle Warren  Procedure(s) Performed: COLONOSCOPY WITH PROPOFOL ESOPHAGOGASTRODUODENOSCOPY (EGD) WITH PROPOFOL  Patient location during evaluation: Endoscopy Anesthesia Type: General Level of consciousness: awake and alert Pain management: pain level controlled Vital Signs Assessment: post-procedure vital signs reviewed and stable Respiratory status: spontaneous breathing, nonlabored ventilation, respiratory function stable and patient connected to nasal cannula oxygen Cardiovascular status: blood pressure returned to baseline and stable Postop Assessment: no apparent nausea or vomiting Anesthetic complications: no   No notable events documented.   Last Vitals:  Vitals:   08/20/21 0941 08/20/21 0951  BP: (!) 128/59 139/90  Pulse: 65 63  Resp: 15 15  Temp:    SpO2: 99% 99%    Last Pain:  Vitals:   08/20/21 0951  TempSrc:   PainSc: 0-No pain                 Precious Haws Marcille Barman

## 2021-08-20 NOTE — Interval H&P Note (Signed)
History and Physical Interval Note: Preprocedure H&P from 08/20/21  was reviewed and there was no interval change after seeing and examining the patient.  Written consent was obtained from the patient after discussion of risks, benefits, and alternatives. Patient has consented to proceed with Esophagogastroduodenoscopy and Colonoscopy with possible intervention   08/20/2021 8:25 AM  Michelle Warren  has presented today for surgery, with the diagnosis of K59.09  - Chronic constipation K21.9  - Gastroesophageal reflux disease, unspecified whether esophagitis present.  The various methods of treatment have been discussed with the patient and family. After consideration of risks, benefits and other options for treatment, the patient has consented to  Procedure(s): COLONOSCOPY WITH PROPOFOL (N/A) ESOPHAGOGASTRODUODENOSCOPY (EGD) WITH PROPOFOL (N/A) as a surgical intervention.  The patient's history has been reviewed, patient examined, no change in status, stable for surgery.  I have reviewed the patient's chart and labs.  Questions were answered to the patient's satisfaction.     [name]

## 2021-08-20 NOTE — Op Note (Signed)
[facility] Gastroenterology Patient Name: Michelle Warren Procedure Date: 08/20/2021 8:26 AM MRN: [id number] Account #: [id number] Date of Birth: [birth year] Admit Type: Outpatient Age: 71 Room: [facility] ENDO ROOM 1 Gender: Female Note Status: Finalized Instrument Name: Michaelle Birks [id number] Procedure:             Upper GI endoscopy Indications:           Dysphagia Providers:             Rueben Bash, DO Referring MD:          Ramonita Lab, MD (Referring MD) Medicines:             Monitored Anesthesia Care Complications:         No immediate complications. Estimated blood loss:                         Minimal. Procedure:             Pre-Anesthesia Assessment:                        - Prior to the procedure, a History and Physical was                         performed, and patient medications and allergies were                         reviewed. The patient is competent. The risks and                         benefits of the procedure and the sedation options and                         risks were discussed with the patient. All questions                         were answered and informed consent was obtained.                         Patient identification and proposed procedure were                         verified by the physician, the nurse, the anesthetist                         and the technician in the endoscopy suite. Mental                         Status Examination: alert and oriented. Airway                         Examination: normal oropharyngeal airway and neck                         mobility. Respiratory Examination: clear to                         auscultation. CV Examination: RRR, no murmurs, no S3  or S4. Prophylactic Antibiotics: The patient does not                         require prophylactic antibiotics. Prior                         Anticoagulants: The patient has taken no previous                          anticoagulant or antiplatelet agents. ASA Grade                         Assessment: II - A patient with mild systemic disease.                         After reviewing the risks and benefits, the patient                         was deemed in satisfactory condition to undergo the                         procedure. The anesthesia plan was to use monitored                         anesthesia care (MAC). Immediately prior to                         administration of medications, the patient was                         re-assessed for adequacy to receive sedatives. The                         heart rate, respiratory rate, oxygen saturations,                         blood pressure, adequacy of pulmonary ventilation, and                         response to care were monitored throughout the                         procedure. The physical status of the patient was                         re-assessed after the procedure.                        After obtaining informed consent, the endoscope was                         passed under direct vision. Throughout the procedure,                         the patient's blood pressure, pulse, and oxygen                         saturations were monitored continuously. The Endoscope  was introduced through the mouth, and advanced to the                         second part of duodenum. The upper GI endoscopy was                         accomplished without difficulty. The patient tolerated                         the procedure well. Findings:      The duodenal bulb, first portion of the duodenum and second portion of       the duodenum were normal. Estimated blood loss: none.      Localized mild inflammation characterized by erythema was found in the       gastric antrum. Biopsies were taken with a cold forceps for Helicobacter       pylori testing. Estimated blood loss was minimal.      A small hiatal hernia was present. Estimated blood  loss: none.      The Z-line was regular. Estimated blood loss: none.      Esophagogastric landmarks were identified: the gastroesophageal junction       was found at 38 cm from the incisors.      Mucosal changes including circumferential folds were found in the entire       esophagus. Esophageal findings were graded using the Eosinophilic       Esophagitis Endoscopic Reference Score (EoE-EREFS) as: Rings Grade 2       Moderate (distinct rings that do not occlude passage of diagnostic 8-10       mm endoscope), Exudates Grade 0 None (no white lesions seen), Furrows       Grade 0 None (no vertical lines seen) and Stricture none (no stricture       found). The scope was withdrawn. Dilation was performed with a Maloney       dilator with no resistance at 75 Fr, 48 Fr and 52 Fr. The dilation site       was examined following endoscope reinsertion and showed mild mucosal       disruption. mild disruption only with 52 fr maloney Estimated blood loss       was minimal.      A widely patent and obstructing Schatzki ring was found at the       gastroesophageal junction. forcep bite taken to break ring after no       change with 52 fr maloney Estimated blood loss was minimal. Impression:            - Normal duodenal bulb, first portion of the duodenum                         and second portion of the duodenum.                        - Gastritis. Biopsied.                        - Small hiatal hernia.                        - Z-line regular.                        -  Esophagogastric landmarks identified.                        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 Dilated. Recommendation:        - Discharge patient to home.                        - Clear liquid diet.                        - Continue present medications.                        - Await pathology results.                        - Repeat upper endoscopy for surveillance based on                          pathology results.                        - Return to GI office as previously scheduled.                        - The findings and recommendations were discussed with                         the patient. Procedure Code(s):     --- Professional ---                        [phone], Esophagogastroduodenoscopy, flexible,                         transoral; with biopsy, single or multiple                        43450, Dilation of esophagus, by unguided sound or                         bougie, single or multiple passes Diagnosis Code(s):     --- Professional ---                        K29.70, Gastritis, unspecified, without bleeding                        K44.9, Diaphragmatic hernia without obstruction or                         gangrene                        V40.0, Eosinophilic esophagitis                        R13.10, Dysphagia, unspecified CPT copyright 2019 American Medical Association. All rights reserved. The codes documented in this report are preliminary and upon coder review may  be revised to meet current compliance requirements. Attending Participation:  I personally performed the entire procedure. Volney American, DO [name] DO, DO 08/20/2021 9:30:29 AM This report has been signed electronically. Number of Addenda: 0 Note Initiated On: 08/20/2021 8:26 AM Estimated Blood Loss:  Estimated blood loss was minimal.      [facility]

## 2021-08-20 NOTE — Op Note (Addendum)
[facility] Gastroenterology Patient Name: Michelle Warren Procedure Date: 08/20/2021 8:25 AM MRN: [id number] Account #: [id number] Date of Birth: June 22, 1950 Admit Type: Outpatient Age: 71 Room: [facility] ENDO ROOM 1 Gender: Female Note Status: Supervisor Override Instrument Name: Jasper Riling [id number] Procedure:             Colonoscopy Indications:           Chronic constipation Providers:             Rueben Bash, DO Referring MD:          Ramonita Lab, MD (Referring MD) Medicines:             Monitored Anesthesia Care Complications:         No immediate complications. Estimated blood loss: None. Procedure:             Pre-Anesthesia Assessment:                        - Prior to the procedure, a History and Physical was                         performed, and patient medications and allergies were                         reviewed. The patient is competent. The risks and                         benefits of the procedure and the sedation options and                         risks were discussed with the patient. All questions                         were answered and informed consent was obtained.                         Patient identification and proposed procedure were                         verified by the physician, the nurse, the anesthetist                         and the technician in the endoscopy suite. Mental                         Status Examination: alert and oriented. Airway                         Examination: normal oropharyngeal airway and neck                         mobility. Respiratory Examination: clear to                         auscultation. CV Examination: RRR, no murmurs, no S3                         or S4. Prophylactic Antibiotics: The patient does not  require prophylactic antibiotics. Prior                         Anticoagulants: The patient has taken no previous                         anticoagulant or  antiplatelet agents. ASA Grade                         Assessment: II - A patient with mild systemic disease.                         After reviewing the risks and benefits, the patient                         was deemed in satisfactory condition to undergo the                         procedure. The anesthesia plan was to use monitored                         anesthesia care (MAC). Immediately prior to                         administration of medications, the patient was                         re-assessed for adequacy to receive sedatives. The                         heart rate, respiratory rate, oxygen saturations,                         blood pressure, adequacy of pulmonary ventilation, and                         response to care were monitored throughout the                         procedure. The physical status of the patient was                         re-assessed after the procedure.                        After obtaining informed consent, the colonoscope was                         passed under direct vision. Throughout the procedure,                         the patient's blood pressure, pulse, and oxygen                         saturations were monitored continuously. The                         Colonoscope was introduced through the anus with the  intention of advancing to the cecum. The scope was                         advanced to the ascending colon before the procedure                         was aborted. Medications were given. The colonoscopy                         was performed with difficulty due to poor bowel prep                         with stool present. Successful completion of the                         procedure was aided by lavage. The patient tolerated                         the procedure well. The quality of the bowel                         preparation was poor. Findings:      Hemorrhoids were found on perianal exam.       Non-bleeding internal hemorrhoids were found during retroflexion and       during perianal exam. The hemorrhoids were Grade II (internal       hemorrhoids that prolapse but reduce spontaneously). Estimated blood       loss: none.      Solid and liquid stool throughout the colon. Was able to reach the       ascending colon- no large mass noted. However, upon reaching >80% of the       mucosa was obscured by stool and could not suction as it was very thick       and solid. Estimated blood loss: none. Impression:            - Preparation of the colon was poor.                        - Hemorrhoids found on perianal exam.                        - Non-bleeding internal hemorrhoids.                        - No specimens collected. Recommendation:        - Discharge patient to home.                        - Clear liquid diet.                        - Continue present medications.                        - Repeat colonoscopy tomorrow because the bowel                         preparation was poor, if willing.                        -  Return to GI office as previously scheduled.                        - The findings and recommendations were discussed with                         the patient. Procedure Code(s):     --- Professional ---                        [phone], 50, Colonoscopy, flexible; diagnostic,                         including collection of specimen(s) by brushing or                         washing, when performed (separate procedure) Diagnosis Code(s):     --- Professional ---                        Z12.11, Encounter for screening for malignant neoplasm                         of colon                        K64.1, Second degree hemorrhoids CPT copyright 2019 American [employer] Association. All rights reserved. The codes documented in this report are preliminary and upon coder review may  be revised to meet current compliance requirements. Attending Participation:      I personally  performed the entire procedure. Volney American, DO [name] DO, DO 08/20/2021 9:25:25 AM This report has been signed electronically. Number of Addenda: 0 Note Initiated On: 08/20/2021 8:25 AM Total Procedure Duration: 0 hours 17 minutes 47 seconds  Estimated Blood Loss:  Estimated blood loss: none.      [facility]

## 2021-08-20 NOTE — H&P (Signed)
Pre-Procedure H&P   Patient ID: Michelle Warren is a 71 y.o. female.  Gastroenterology Provider: [name], DO  Referring Provider: Laurine Blazer, PA PCP: Adin Hector, MD  Date: 08/20/2021  HPI Michelle Warren is a 71 y.o. female who presents today for Esophagogastroduodenoscopy and Colonoscopy for dysphagia, abnormal weight loss, screening.  Patient history with an EOE reporting dysphagia to food and liquids.  Also notes epigastric pain and nausea that is worse postprandially.  She denies any vomiting or pill dysphagia.  Since October 2022 she has lost 20 pounds.  She deals with chronic constipation which she is taking Dulcolax every other day with additional MiraLAX.  She was prescribed Amitiza but this was not filled due to insurance issues.  On protonix '40mg'$  twice daily- still with reflux sx  02/2021- normal egd with increased eos; small hh Colon- poor prep- recommend repeat  S/p hysto  Past Medical History:  Diagnosis Date   Basal cell carcinoma 04/11/2019   L sup nasolabial lat to nasal ala. Nodular. MOHs 06/04/19   Bladder prolapse, female, acquired    Chicken pox    Dysplastic nevus 12/19/2007   Right proximal lat. dorsum finger. Slight atypia, margins involved.    Dysplastic nevus 12/19/2007   Right sup. lat. scapula. Slight to moderate atypia, close to margin.   Dysplastic nevus 07/15/2008   Left lateral breast. Slight atypia, edges free.   Dysplastic nevus 07/15/2008   Right mastoid-postauricular. Slight atypia, edges free.   Dysplastic nevus 07/15/2008   Right top of shoulder, lat. Slight atypia, edges free.   GERD (gastroesophageal reflux disease)    Headache    Hemorrhoids    Meningioma (HCC)    Migraines    Squamous cell carcinoma of skin 02/06/2020   Right glabella. WD SCC with superficial infiltration. MOHs 04/18/20   Squamous cell carcinoma of skin 05/14/2020   Right cheek (in situ), EDC   Vertigo     Past Surgical  History:  Procedure Laterality Date   ABDOMINAL HYSTERECTOMY     bladder tack     COLONOSCOPY WITH PROPOFOL N/A 12/22/2016   Procedure: COLONOSCOPY WITH PROPOFOL;  Surgeon: Toledo, Benay Pike, MD;  Location: [facility] ENDOSCOPY;  Service: Gastroenterology;  Laterality: N/A;   COLONOSCOPY WITH PROPOFOL N/A 03/17/2021   Procedure: COLONOSCOPY WITH PROPOFOL;  Surgeon: Lesly Rubenstein, MD;  Location: [facility] ENDOSCOPY;  Service: Endoscopy;  Laterality: N/A;   ESOPHAGOGASTRODUODENOSCOPY     ESOPHAGOGASTRODUODENOSCOPY (EGD) WITH PROPOFOL N/A 03/17/2021   Procedure: ESOPHAGOGASTRODUODENOSCOPY (EGD) WITH PROPOFOL;  Surgeon: Lesly Rubenstein, MD;  Location: [facility] ENDOSCOPY;  Service: Endoscopy;  Laterality: N/A;   repair enterocele vaginal     SALPINGECTOMY Bilateral    SINUS EXPLORATION     TUBAL LIGATION      Family History No h/o GI disease or malignancy  Review of Systems  Constitutional:  Positive for appetite change. Negative for activity change, chills, diaphoresis, fatigue, fever and unexpected weight change.  HENT:  Positive for trouble swallowing. Negative for voice change.   Respiratory:  Negative for shortness of breath and wheezing.   Cardiovascular:  Negative for chest pain, palpitations and leg swelling.  Gastrointestinal:  Positive for abdominal pain, constipation and nausea. Negative for abdominal distention, anal bleeding, blood in stool, diarrhea, rectal pain and vomiting.  Musculoskeletal:  Negative for arthralgias and myalgias.  Skin:  Negative for color change and pallor.  Neurological:  Negative for dizziness, syncope and weakness.  Psychiatric/Behavioral:  Negative for confusion.  All other systems reviewed and are negative.   Medications No current facility-administered medications on file prior to encounter.   Current Outpatient Medications on File Prior to Encounter  Medication Sig Dispense Refill   famotidine (PEPCID) 20 MG tablet Take 20 mg by mouth 2 (two)  times daily.     gabapentin (NEURONTIN) 800 MG tablet Take 800 mg by mouth 3 (three) times daily.     pantoprazole (PROTONIX) 40 MG tablet SMARTSIG:1 Tablet(s) By Mouth Morning-Night     sertraline (ZOLOFT) 50 MG tablet Take 50 mg by mouth daily.     topiramate (TOPAMAX) 200 MG tablet Take 200 mg by mouth 2 (two) times daily.     albuterol (VENTOLIN HFA) 108 (90 Base) MCG/ACT inhaler Inhale into the lungs every 6 (six) hours as needed for wheezing or shortness of breath.     Azelastine HCl 137 MCG/SPRAY SOLN Place into the nose 2 (two) times daily as needed.     ciclopirox (PENLAC) 8 % solution APPLY TO NAILS AT BEDTIME AS DIRECTED 6.6 mL 11   cyclobenzaprine (FLEXERIL) 10 MG tablet Take by mouth. (Patient not taking: Reported on 03/17/2021)     diphenhydrAMINE (BENADRYL) 25 mg capsule Take 25 mg by mouth every 6 (six) hours as needed.     docusate sodium (COLACE) 100 MG capsule Take 100 mg by mouth 2 (two) times daily.     eletriptan (RELPAX) 40 MG tablet Take 40 mg by mouth as needed for migraine or headache. May repeat in 2 hours if headache persists or recurs.     Erenumab-aooe 140 MG/ML SOAJ Inject into the skin every 28 (twenty-eight) days.     hydrocortisone (ANUSOL-HC) 2.5 % rectal cream Place 1 application rectally 2 (two) times daily.     ibuprofen (ADVIL,MOTRIN) 600 MG tablet Take 1 tablet (600 mg total) by mouth every 8 (eight) hours as needed. 30 tablet 0   ketorolac (TORADOL) 10 MG tablet Take 1 tablet (10 mg total) by mouth every 8 (eight) hours. 15 tablet 0   lactulose (CHRONULAC) 10 GM/15ML solution Take by mouth daily as needed for mild constipation.     levocetirizine (XYZAL) 5 MG tablet Take by mouth.     linaclotide (LINZESS) 72 MCG capsule Take 72 mcg by mouth daily before breakfast. (Patient not taking: Reported on 03/17/2021)     meclizine (ANTIVERT) 12.5 MG tablet TAKE 1 2 TABLETS (12.5 25 MG TOTAL) BY MOUTH 3 (THREE) TIMES DAILY AS NEEDED (MOTION SICKNESS)     Melatonin  10 MG CAPS Take by mouth.     meloxicam (MOBIC) 15 MG tablet Take 15 mg by mouth daily as needed.     neomycin-polymyxin-hydrocortisone (CORTISPORIN) OTIC solution Apply 1-2 drops to toe after soaking twice a day 10 mL 0   orphenadrine (NORFLEX) 100 MG tablet Take 1 tablet (100 mg total) by mouth 2 (two) times daily as needed for muscle spasms. (Patient not taking: Reported on 03/17/2021) 20 tablet 0   prochlorperazine (COMPAZINE) 10 MG tablet Take 1 tablet (10 mg total) by mouth every 8 (eight) hours as needed for nausea or vomiting. 15 tablet 0   senna (SENOKOT) 8.6 MG tablet Take 1 tablet by mouth daily.     Urea 39 % CREA Apply 1 application topically daily. 227 g 5   valACYclovir (VALTREX) 500 MG tablet TAKE 1 TABLET BY MOUTH TWICE DAILY IF NEEDED FOR FLARE     Varenicline Tartrate (TYRVAYA) 0.03 MG/ACT SOLN Place into the nose.  zolpidem (AMBIEN) 5 MG tablet Take 5 mg by mouth at bedtime as needed for sleep.      Pertinent medications related to GI and procedure were reviewed by me with the patient prior to the procedure   Current Facility-Administered Medications:    0.9 %  sodium chloride infusion, , Intravenous, Continuous, [name], DO      Allergies  Allergen Reactions   Macrobid [Nitrofurantoin Macrocrystal] Shortness Of Breath   Trazodone Nausea Only   Allergies were reviewed by me prior to the procedure  Objective   Body mass index is 22.66 kg/m. Vitals:   08/20/21 0809  BP: 132/79  Pulse: 68  Resp: 16  Temp: 97.7 F (36.5 C)  TempSrc: Temporal  SpO2: 98%  Weight: 59.9 kg  Height: '5\' 4"'$  (1.626 m)    Physical Exam Vitals and nursing note reviewed.  Constitutional:      General: She is not in acute distress.    Appearance: Normal appearance. She is not ill-appearing, toxic-appearing or diaphoretic.  HENT:     Head: Normocephalic and atraumatic.     Nose: Nose normal.     Mouth/Throat:     Mouth: Mucous membranes are moist.     Pharynx:  Oropharynx is clear.  Eyes:     General: No scleral icterus.    Extraocular Movements: Extraocular movements intact.  Cardiovascular:     Rate and Rhythm: Normal rate and regular rhythm.     Heart sounds: Normal heart sounds. No murmur heard.   No friction rub. No gallop.  Pulmonary:     Effort: Pulmonary effort is normal. No respiratory distress.     Breath sounds: Normal breath sounds. No wheezing, rhonchi or rales.  Abdominal:     General: There is no distension.     Palpations: Abdomen is soft.     Tenderness: There is no abdominal tenderness. There is no guarding or rebound.     Comments: hypoactive  Musculoskeletal:     Cervical back: Neck supple.     Right lower leg: No edema.     Left lower leg: No edema.  Skin:    General: Skin is warm and dry.     Coloration: Skin is not jaundiced or pale.  Neurological:     General: No focal deficit present.     Mental Status: She is alert and oriented to person, place, and time. Mental status is at baseline.  Psychiatric:        Mood and Affect: Mood normal.        Behavior: Behavior normal.        Thought Content: Thought content normal.        Judgment: Judgment normal.     Assessment:  Ms. Jullia Mulligan is a 71 y.o. female  who presents today for Esophagogastroduodenoscopy and Colonoscopy for dysphagia (EOE), weight loss, (screening colonoscopy.  Plan:  Esophagogastroduodenoscopy and Colonoscopy with possible intervention today  Esophagogastroduodenoscopy and Colonoscopy with possible biopsy, control of bleeding, polypectomy, and interventions as necessary has been discussed with the patient/patient representative. Informed consent was obtained from the patient/patient representative after explaining the indication, nature, and risks of the procedure including but not limited to death, bleeding, perforation, missed neoplasm/lesions, cardiorespiratory compromise, and reaction to medications. Opportunity for questions was given  and appropriate answers were provided. Patient/patient representative has verbalized understanding is amenable to undergoing the procedure.   [name], DO  [facility]  Portions of the record may have been created  with voice recognition software. Occasional wrong-word or 'sound-a-like' substitutions may have occurred due to the inherent limitations of voice recognition software.  Read the chart carefully and recognize, using context, where substitutions may have occurred.

## 2021-08-20 NOTE — Transfer of Care (Signed)
Immediate Anesthesia Transfer of Care Note  Patient: Michelle Warren  Procedure(s) Performed: COLONOSCOPY WITH PROPOFOL ESOPHAGOGASTRODUODENOSCOPY (EGD) WITH PROPOFOL  Patient Location: PACU  Anesthesia Type:General  Level of Consciousness: sedated  Airway & Oxygen Therapy: Patient Spontanous Breathing and Patient connected to nasal cannula oxygen  Post-op Assessment: Report given to RN and Post -op Vital signs reviewed and stable  Post vital signs: Reviewed and stable  Last Vitals:  Vitals Value Taken Time  BP 127/74 08/20/21 0922  Temp 36.4 C 08/20/21 0922  Pulse 64 08/20/21 0925  Resp 19 08/20/21 0925  SpO2 100 % 08/20/21 0925  Vitals shown include unvalidated device data.  Last Pain:  Vitals:   08/20/21 0922  TempSrc: Temporal  PainSc: 0-No pain         Complications: No notable events documented.

## 2021-08-20 NOTE — Anesthesia Postprocedure Evaluation (Deleted)
Anesthesia Post Note  Patient: Michelle Warren  Procedure(s) Performed: COLONOSCOPY WITH PROPOFOL ESOPHAGOGASTRODUODENOSCOPY (EGD) WITH PROPOFOL  Patient location during evaluation: Endoscopy Anesthesia Type: General Level of consciousness: awake and alert Pain management: pain level controlled Vital Signs Assessment: post-procedure vital signs reviewed and stable Respiratory status: spontaneous breathing, nonlabored ventilation, respiratory function stable and patient connected to nasal cannula oxygen Cardiovascular status: blood pressure returned to baseline and stable Postop Assessment: no apparent nausea or vomiting Anesthetic complications: no   No notable events documented.   Last Vitals:  Vitals:   08/20/21 0809  BP: 132/79  Pulse: 68  Resp: 16  Temp: 36.5 C  SpO2: 98%    Last Pain:  Vitals:   08/20/21 0809  TempSrc: Temporal  PainSc: 0-No pain                 Precious Haws Harmon Bommarito

## 2021-08-21 ENCOUNTER — Ambulatory Visit: Payer: Medicare HMO | Admitting: Anesthesiology

## 2021-08-21 ENCOUNTER — Encounter: Payer: Self-pay | Admitting: Gastroenterology

## 2021-08-21 ENCOUNTER — Encounter
Admission: RE | Disposition: A | Discharge status: Home / Self Care | Payer: Self-pay | Source: Home / Self Care | Attending: Gastroenterology

## 2021-08-21 ENCOUNTER — Ambulatory Visit
Admission: RE | Admit: 2021-08-21 | Discharge: 2021-08-21 | Disposition: A | Discharge status: Home / Self Care | Payer: Medicare HMO | Attending: Gastroenterology | Admitting: Gastroenterology

## 2021-08-21 ENCOUNTER — Other Ambulatory Visit: Payer: Self-pay

## 2021-08-21 DIAGNOSIS — K5909 Other constipation: Secondary | ICD-10-CM | POA: Insufficient documentation

## 2021-08-21 DIAGNOSIS — D122 Benign neoplasm of ascending colon: Secondary | ICD-10-CM | POA: Insufficient documentation

## 2021-08-21 DIAGNOSIS — Q438 Other specified congenital malformations of intestine: Secondary | ICD-10-CM | POA: Diagnosis not present

## 2021-08-21 DIAGNOSIS — K219 Gastro-esophageal reflux disease without esophagitis: Secondary | ICD-10-CM | POA: Diagnosis not present

## 2021-08-21 DIAGNOSIS — K641 Second degree hemorrhoids: Secondary | ICD-10-CM | POA: Insufficient documentation

## 2021-08-21 HISTORY — PX: COLONOSCOPY: SHX5424

## 2021-08-21 SURGERY — COLONOSCOPY
Anesthesia: General

## 2021-08-21 MED ORDER — LIDOCAINE HCL (CARDIAC) PF 100 MG/5ML IV SOSY
PREFILLED_SYRINGE | INTRAVENOUS | Status: DC | PRN
  Administered 2021-08-21: 50 mg via INTRAVENOUS

## 2021-08-21 MED ORDER — PROPOFOL 10 MG/ML IV BOLUS
INTRAVENOUS | Status: DC | PRN
  Administered 2021-08-21: 50 mg via INTRAVENOUS

## 2021-08-21 MED ORDER — PROPOFOL 10 MG/ML IV BOLUS
INTRAVENOUS | Status: AC
  Filled 2021-08-21: qty 20

## 2021-08-21 MED ORDER — SODIUM CHLORIDE 0.9 % IV SOLN: INTRAVENOUS | Status: DC

## 2021-08-21 MED ORDER — PROPOFOL 500 MG/50ML IV EMUL
INTRAVENOUS | Status: DC | PRN
  Administered 2021-08-21: 150 ug/kg/min via INTRAVENOUS

## 2021-08-21 NOTE — H&P (Signed)
Pre-Procedure H&P   Patient ID: Michelle Warren is a 71 y.o. female.  Gastroenterology Provider: [name], DO  Referring Provider: Laurine Blazer, PA PCP: Adin Hector, MD  Date: 08/21/2021  HPI Ms. Michelle Warren is a 71 y.o. female who presents today for Colonoscopy for abnormal weight loss and screening.   Attempted to perform colonoscopy yesterday, but pt with poor prep which has been an ongoing issue. In d/w pt she does not drink enough water. Used sutab o/n   Since October 2022 she has lost 20 pounds.  She deals with chronic constipation which she is taking Dulcolax every other day with additional MiraLAX.  She was prescribed Amitiza but this was not filled due to insurance issues.   02/2021- Colon- poor prep- recommend repeat   S/p hysto  Past Medical History:  Diagnosis Date   Basal cell carcinoma 04/11/2019   L sup nasolabial lat to nasal ala. Nodular. MOHs 06/04/19   Bladder prolapse, female, acquired    Chicken pox    Dysplastic nevus 12/19/2007   Right proximal lat. dorsum finger. Slight atypia, margins involved.    Dysplastic nevus 12/19/2007   Right sup. lat. scapula. Slight to moderate atypia, close to margin.   Dysplastic nevus 07/15/2008   Left lateral breast. Slight atypia, edges free.   Dysplastic nevus 07/15/2008   Right mastoid-postauricular. Slight atypia, edges free.   Dysplastic nevus 07/15/2008   Right top of shoulder, lat. Slight atypia, edges free.   GERD (gastroesophageal reflux disease)    Headache    Hemorrhoids    Meningioma (HCC)    Migraines    Squamous cell carcinoma of skin 02/06/2020   Right glabella. WD SCC with superficial infiltration. MOHs 04/18/20   Squamous cell carcinoma of skin 05/14/2020   Right cheek (in situ), EDC   Vertigo     Past Surgical History:  Procedure Laterality Date   ABDOMINAL HYSTERECTOMY     bladder tack     COLONOSCOPY WITH PROPOFOL N/A 12/22/2016   Procedure: COLONOSCOPY WITH  PROPOFOL;  Surgeon: Toledo, Benay Pike, MD;  Location: [facility] ENDOSCOPY;  Service: Gastroenterology;  Laterality: N/A;   COLONOSCOPY WITH PROPOFOL N/A 03/17/2021   Procedure: COLONOSCOPY WITH PROPOFOL;  Surgeon: Lesly Rubenstein, MD;  Location: [facility] ENDOSCOPY;  Service: Endoscopy;  Laterality: N/A;   COLONOSCOPY WITH PROPOFOL N/A 08/20/2021   Procedure: COLONOSCOPY WITH PROPOFOL;  Surgeon: [name], DO;  Location: [facility] ENDOSCOPY;  Service: Gastroenterology;  Laterality: N/A;   ESOPHAGOGASTRODUODENOSCOPY     ESOPHAGOGASTRODUODENOSCOPY (EGD) WITH PROPOFOL N/A 03/17/2021   Procedure: ESOPHAGOGASTRODUODENOSCOPY (EGD) WITH PROPOFOL;  Surgeon: Lesly Rubenstein, MD;  Location: [facility] ENDOSCOPY;  Service: Endoscopy;  Laterality: N/A;   ESOPHAGOGASTRODUODENOSCOPY (EGD) WITH PROPOFOL N/A 08/20/2021   Procedure: ESOPHAGOGASTRODUODENOSCOPY (EGD) WITH PROPOFOL;  Surgeon: [name], DO;  Location: [place];  Service: Gastroenterology;  Laterality: N/A;   repair enterocele vaginal     SALPINGECTOMY Bilateral    SINUS EXPLORATION     TUBAL LIGATION      Family History No h/o GI disease or malignancy  Review of Systems  Constitutional:  Positive for appetite change. Negative for activity change, chills, diaphoresis, fatigue, fever and unexpected weight change.  HENT:  Positive for trouble swallowing. Negative for voice change.   Respiratory:  Negative for shortness of breath and wheezing.   Cardiovascular:  Negative for chest pain, palpitations and leg swelling.  Gastrointestinal:  Positive for abdominal pain, constipation and nausea. Negative for abdominal distention,  anal bleeding, blood in stool, diarrhea, rectal pain and vomiting.  Musculoskeletal:  Negative for arthralgias and myalgias.  Skin:  Negative for color change and pallor.  Neurological:  Negative for dizziness, syncope and weakness.  Psychiatric/Behavioral:  Negative for confusion.   All other systems reviewed  and are negative.   Medications No current facility-administered medications on file prior to encounter.   Current Outpatient Medications on File Prior to Encounter  Medication Sig Dispense Refill   eletriptan (RELPAX) 40 MG tablet Take 40 mg by mouth as needed for migraine or headache. May repeat in 2 hours if headache persists or recurs.     gabapentin (NEURONTIN) 800 MG tablet Take 800 mg by mouth 3 (three) times daily.     pantoprazole (PROTONIX) 40 MG tablet SMARTSIG:1 Tablet(s) By Mouth Morning-Night     topiramate (TOPAMAX) 200 MG tablet Take 200 mg by mouth 2 (two) times daily.     zolpidem (AMBIEN) 5 MG tablet Take 5 mg by mouth at bedtime as needed for sleep.     albuterol (VENTOLIN HFA) 108 (90 Base) MCG/ACT inhaler Inhale into the lungs every 6 (six) hours as needed for wheezing or shortness of breath.     Azelastine HCl 137 MCG/SPRAY SOLN Place into the nose 2 (two) times daily as needed.     ciclopirox (PENLAC) 8 % solution APPLY TO NAILS AT BEDTIME AS DIRECTED 6.6 mL 11   cyclobenzaprine (FLEXERIL) 10 MG tablet Take by mouth. (Patient not taking: Reported on 03/17/2021)     diphenhydrAMINE (BENADRYL) 25 mg capsule Take 25 mg by mouth every 6 (six) hours as needed.     docusate sodium (COLACE) 100 MG capsule Take 100 mg by mouth 2 (two) times daily.     Erenumab-aooe 140 MG/ML SOAJ Inject into the skin every 28 (twenty-eight) days.     famotidine (PEPCID) 20 MG tablet Take 20 mg by mouth 2 (two) times daily.     fluorouracil (EFUDEX) 5 % cream In one month (07/27/21) apply to aa's upper lip BID x 7 days. 15 g 0   hydrocortisone (ANUSOL-HC) 2.5 % rectal cream Place 1 application rectally 2 (two) times daily.     ibuprofen (ADVIL,MOTRIN) 600 MG tablet Take 1 tablet (600 mg total) by mouth every 8 (eight) hours as needed. 30 tablet 0   ketorolac (TORADOL) 10 MG tablet Take 1 tablet (10 mg total) by mouth every 8 (eight) hours. 15 tablet 0   lactulose (CHRONULAC) 10 GM/15ML solution  Take by mouth daily as needed for mild constipation.     levocetirizine (XYZAL) 5 MG tablet Take by mouth.     linaclotide (LINZESS) 72 MCG capsule Take 72 mcg by mouth daily before breakfast. (Patient not taking: Reported on 03/17/2021)     meclizine (ANTIVERT) 12.5 MG tablet TAKE 1 2 TABLETS (12.5 25 MG TOTAL) BY MOUTH 3 (THREE) TIMES DAILY AS NEEDED (MOTION SICKNESS)     Melatonin 10 MG CAPS Take by mouth.     meloxicam (MOBIC) 15 MG tablet Take 15 mg by mouth daily as needed.     neomycin-polymyxin-hydrocortisone (CORTISPORIN) OTIC solution Apply 1-2 drops to toe after soaking twice a day 10 mL 0   orphenadrine (NORFLEX) 100 MG tablet Take 1 tablet (100 mg total) by mouth 2 (two) times daily as needed for muscle spasms. (Patient not taking: Reported on 03/17/2021) 20 tablet 0   prochlorperazine (COMPAZINE) 10 MG tablet Take 1 tablet (10 mg total) by mouth every 8 (eight)  hours as needed for nausea or vomiting. 15 tablet 0   senna (SENOKOT) 8.6 MG tablet Take 1 tablet by mouth daily.     sertraline (ZOLOFT) 50 MG tablet Take 50 mg by mouth daily.     Urea 39 % CREA Apply 1 application topically daily. 227 g 5   valACYclovir (VALTREX) 500 MG tablet TAKE 1 TABLET BY MOUTH TWICE DAILY IF NEEDED FOR FLARE     Varenicline Tartrate (TYRVAYA) 0.03 MG/ACT SOLN Place into the nose.      Pertinent medications related to GI and procedure were reviewed by me with the patient prior to the procedure   Current Facility-Administered Medications:    0.9 %  sodium chloride infusion, , Intravenous, Continuous, [name], DO  sodium chloride         Allergies  Allergen Reactions   Macrobid [Nitrofurantoin Macrocrystal] Shortness Of Breath   Trazodone Nausea Only   Allergies were reviewed by me prior to the procedure  Objective   Body mass index is 22.11 kg/m. Vitals:   08/21/21 1319  BP: 132/84  Pulse: 79  Resp: 16  Temp: 97.6 F (36.4 C)  TempSrc: Temporal  SpO2: 100%   Weight: 58.4 kg  Height: '5\' 4"'$  (1.626 m)     Physical Exam Vitals and nursing note reviewed.  Constitutional:      General: She is not in acute distress.    Appearance: Normal appearance. She is not ill-appearing, toxic-appearing or diaphoretic.  HENT:     Head: Normocephalic and atraumatic.     Nose: Nose normal.     Mouth/Throat:     Mouth: Mucous membranes are moist.     Pharynx: Oropharynx is clear.  Eyes:     General: No scleral icterus.    Extraocular Movements: Extraocular movements intact.  Cardiovascular:     Rate and Rhythm: Normal rate and regular rhythm.     Heart sounds: Normal heart sounds. No murmur heard.   No friction rub. No gallop.  Pulmonary:     Effort: Pulmonary effort is normal. No respiratory distress.     Breath sounds: Normal breath sounds. No wheezing, rhonchi or rales.  Abdominal:     General: Bowel sounds are normal. There is no distension.     Palpations: Abdomen is soft.     Tenderness: There is no abdominal tenderness. There is no guarding or rebound.  Musculoskeletal:     Cervical back: Neck supple.     Right lower leg: No edema.     Left lower leg: No edema.  Skin:    General: Skin is warm and dry.     Coloration: Skin is not jaundiced or pale.  Neurological:     General: No focal deficit present.     Mental Status: She is alert and oriented to person, place, and time. Mental status is at baseline.  Psychiatric:        Mood and Affect: Mood normal.        Behavior: Behavior normal.        Thought Content: Thought content normal.        Judgment: Judgment normal.     Assessment:  Ms. Michelle Warren is a 71 y.o. female  who presents today for Colonoscopy for abnormal weight loss, screening.  Plan:  Colonoscopy with possible intervention today  Colonoscopy with possible biopsy, control of bleeding, polypectomy, and interventions as necessary has been discussed with the patient/patient representative. Informed consent was obtained  from the patient/patient  representative after explaining the indication, nature, and risks of the procedure including but not limited to death, bleeding, perforation, missed neoplasm/lesions, cardiorespiratory compromise, and reaction to medications. Opportunity for questions was given and appropriate answers were provided. Patient/patient representative has verbalized understanding is amenable to undergoing the procedure.   [name], DO  [facility] Gastroenter[facility]  Portions of the record may have been created with voice recognition software. Occasional wrong-word or 'sound-a-like' substitutions may have occurred due to the inherent limitations of voice recognition software.  Read the chart carefully and recognize, using context, where substitutions may have occurred.

## 2021-08-21 NOTE — Anesthesia Preprocedure Evaluation (Addendum)
Anesthesia Evaluation  Patient identified by MRN, date of birth, ID band Patient awake    Reviewed: Allergy & Precautions, NPO status , Patient's Chart, lab work & pertinent test results  History of Anesthesia Complications Negative for: history of anesthetic complications  Airway Mallampati: III  TM Distance: <3 FB Neck ROM: full    Dental  (+) Chipped, Caps   Pulmonary neg pulmonary ROS, neg shortness of breath,    Pulmonary exam normal breath sounds clear to auscultation       Cardiovascular Exercise Tolerance: Good (-) angina(-) Past MI negative cardio ROS Normal cardiovascular exam Rhythm:Regular     Neuro/Psych  Headaches, negative neurological ROS  negative psych ROS   GI/Hepatic negative GI ROS, Neg liver ROS, hiatal hernia, GERD  Controlled,small hiatal hernia EGD 08/20/21   Endo/Other  negative endocrine ROS  Renal/GU negative Renal ROS  negative genitourinary   Musculoskeletal   Abdominal Normal abdominal exam  (+)   Peds  Hematology negative hematology ROS (+)   Anesthesia Other Findings Past Medical History: 04/11/2019: Basal cell carcinoma     Comment:  L sup nasolabial lat to nasal ala. Nodular. MOHs               06/04/19 No date: Bladder prolapse, female, acquired No date: Chicken pox 12/19/2007: Dysplastic nevus     Comment:  Right proximal lat. dorsum finger. Slight atypia,               margins involved.  12/19/2007: Dysplastic nevus     Comment:  Right sup. lat. scapula. Slight to moderate atypia,               close to margin. 07/15/2008: Dysplastic nevus     Comment:  Left lateral breast. Slight atypia, edges free. 07/15/2008: Dysplastic nevus     Comment:  Right mastoid-postauricular. Slight atypia, edges free. 07/15/2008: Dysplastic nevus     Comment:  Right top of shoulder, lat. Slight atypia, edges free. No date: GERD (gastroesophageal reflux disease) No date: Headache No date:  Hemorrhoids No date: Meningioma [facility]) No date: Migraines 02/06/2020: Squamous cell carcinoma of skin     Comment:  Right glabella. WD SCC with superficial infiltration.               MOHs 04/18/20 05/14/2020: Squamous cell carcinoma of skin     Comment:  Right cheek (in situ), EDC No date: Vertigo  Past Surgical History: No date: ABDOMINAL HYSTERECTOMY No date: bladder tack 12/22/2016: COLONOSCOPY WITH PROPOFOL; N/A     Comment:  Procedure: COLONOSCOPY WITH PROPOFOL;  Surgeon: [place],               Benay Pike, MD;  Location: [facility] ENDOSCOPY;  Service:               Gastroenterology;  Laterality: N/A; 03/17/2021: COLONOSCOPY WITH PROPOFOL; N/A     Comment:  Procedure: COLONOSCOPY WITH PROPOFOL;  Surgeon:               Lesly Rubenstein, MD;  Location: [facility] ENDOSCOPY;                Service: Endoscopy;  Laterality: N/A; No date: ESOPHAGOGASTRODUODENOSCOPY 03/17/2021: ESOPHAGOGASTRODUODENOSCOPY (EGD) WITH PROPOFOL; N/A     Comment:  Procedure: ESOPHAGOGASTRODUODENOSCOPY (EGD) WITH               PROPOFOL;  Surgeon: Lesly Rubenstein, MD;  Location:  [facility] ENDOSCOPY;  Service: Endoscopy;  Laterality: N/A; No date: repair enterocele vaginal No date: SALPINGECTOMY; Bilateral No date: SINUS EXPLORATION No date: TUBAL LIGATION  BMI    Body Mass Index: 22.66 kg/m      Reproductive/Obstetrics negative OB ROS                            Anesthesia Physical  Anesthesia Plan  ASA: 2  Anesthesia Plan: General   Post-op Pain Management:    Induction: Intravenous  PONV Risk Score and Plan: Propofol infusion and TIVA  Airway Management Planned: Natural Airway and Nasal Cannula  Additional Equipment:   Intra-op Plan:   Post-operative Plan:   Informed Consent: I have reviewed the patients History and Physical, chart, labs and discussed the procedure including the risks, benefits and alternatives for the proposed anesthesia with the patient  or authorized representative who has indicated his/her understanding and acceptance.     Dental Advisory Given  Plan Discussed with: Anesthesiologist, CRNA and Surgeon  Anesthesia Plan Comments: (Patient consented for risks of anesthesia including but not limited to:  - adverse reactions to medications - risk of airway placement if required - damage to eyes, teeth, lips or other oral mucosa - nerve damage due to positioning  - sore throat or hoarseness - Damage to heart, brain, nerves, lungs, other parts of body or loss of life  Patient voiced understanding.)       Anesthesia Quick Evaluation

## 2021-08-21 NOTE — Op Note (Addendum)
[facility] Gastroenterology Patient Name: Michelle Warren Procedure Date: 08/21/2021 2:19 PM MRN: [id number] Account #: [id number] Date of Birth: 1950/10/26 Admit Type: Outpatient Age: 71 Room: [facility] ENDO ROOM 1 Gender: Female Note Status: Supervisor Override Instrument Name: Park Meo [id number] Procedure:             Colonoscopy Indications:           Chronic constipation Providers:             Rueben Bash, DO Referring MD:          Ramonita Lab, MD (Referring MD) Medicines:             Monitored Anesthesia Care Complications:         No immediate complications. Estimated blood loss:                         Minimal. Procedure:             Pre-Anesthesia Assessment:                        - Prior to the procedure, a History and Physical was                         performed, and patient medications and allergies were                         reviewed. The patient is competent. The risks and                         benefits of the procedure and the sedation options and                         risks were discussed with the patient. All questions                         were answered and informed consent was obtained.                         Patient identification and proposed procedure were                         verified by the physician, the nurse, the anesthetist                         and the technician in the endoscopy suite. Mental                         Status Examination: alert and oriented. Airway                         Examination: normal oropharyngeal airway and neck                         mobility. Respiratory Examination: clear to                         auscultation. CV Examination: RRR, no murmurs, no S3  or S4. Prophylactic Antibiotics: The patient does not                         require prophylactic antibiotics. Prior                         Anticoagulants: The patient has taken no previous                          anticoagulant or antiplatelet agents. ASA Grade                         Assessment: II - A patient with mild systemic disease.                         After reviewing the risks and benefits, the patient                         was deemed in satisfactory condition to undergo the                         procedure. The anesthesia plan was to use monitored                         anesthesia care (MAC). Immediately prior to                         administration of medications, the patient was                         re-assessed for adequacy to receive sedatives. The                         heart rate, respiratory rate, oxygen saturations,                         blood pressure, adequacy of pulmonary ventilation, and                         response to care were monitored throughout the                         procedure. The physical status of the patient was                         re-assessed after the procedure.                        After obtaining informed consent, the colonoscope was                         passed under direct vision. Throughout the procedure,                         the patient's blood pressure, pulse, and oxygen                         saturations were monitored continuously. The  Colonoscope was introduced through the anus and                         advanced to the the terminal ileum, with                         identification of the appendiceal orifice and IC                         valve. The patient tolerated the procedure well. The                         colonoscopy was somewhat difficult due to a redundant                         colon and a tortuous colon, retained liquid stool and                         seeds. Successful completion of the procedure was                         aided by straightening and shortening the scope to                         obtain bowel loop reduction, using scope torsion,                         applying  abdominal pressure and lavage. The patient                         tolerated the procedure well. The quality of the bowel                         preparation was fair. Due to retained liquid stool and                         seeds/debris, was not able to visualize 100% of the                         mucosa. No large lesions, but cannot rule out all                         small to medium sized lesions. The terminal ileum,                         ileocecal valve, appendiceal orifice, and rectum were                         photographed. Findings:      The perianal and digital rectal examinations were normal. Pertinent       negatives include normal sphincter tone.      The terminal ileum appeared normal. Estimated blood loss: none.      A 3 to 4 mm polyp was found in the ascending colon. The polyp was       sessile. The polyp was removed with a cold snare. Resection and       retrieval were complete. Estimated blood  loss was minimal.      Non-bleeding internal hemorrhoids were found during retroflexion. The       hemorrhoids were Grade II (internal hemorrhoids that prolapse but reduce       spontaneously). Estimated blood loss: none.      A moderate amount of semi-liquid stool was found in the entire colon,       making visualization difficult. Lavage of the area was performed using a       large amount of sterile water, resulting in clearance with fair       visualization. Estimated blood loss: none.      The exam was otherwise without abnormality on direct and retroflexion       views of what could be visualized. Impression:            - Preparation of the colon was fair.                        - The examined portion of the ileum was normal.                        - One 3 to 4 mm polyp in the ascending colon, removed                         with a cold snare. Resected and retrieved.                        - Non-bleeding internal hemorrhoids.                        - Stool in the entire  examined colon.                        - The examination was otherwise normal on direct and                         retroflexion views. Recommendation:        - Discharge patient to home.                        - Resume previous diet.                        - Continue present medications.                        - Await pathology results.                        - No aspirin, ibuprofen, naproxen, or other                         non-steroidal anti-inflammatory drugs for 5 days after                         polyp removal.                        - Repeat colonoscopy for surveillance based on                         pathology  results.                        - Return to GI office as previously scheduled.                        - The findings and recommendations were discussed with                         the patient. Procedure Code(s):     --- Professional ---                        [phone], Colonoscopy, flexible; with removal of                         tumor(s), polyp(s), or other lesion(s) by snare                         technique Diagnosis Code(s):     --- Professional ---                        Z12.11, Encounter for screening for malignant neoplasm                         of colon                        K64.1, Second degree hemorrhoids                        K63.5, Polyp of colon CPT copyright 2019 American Medical Association. All rights reserved. The codes documented in this report are preliminary and upon coder review may  be revised to meet current compliance requirements. Attending Participation:      I personally performed the entire procedure. Volney American, DO [name] DO, DO 08/21/2021 3:15:24 PM This report has been signed electronically. Number of Addenda: 0 Note Initiated On: 08/21/2021 2:19 PM Scope Withdrawal Time: 0 hours 12 minutes 22 seconds  Total Procedure Duration: 0 hours 35 minutes 20 seconds  Estimated Blood Loss:  Estimated blood loss was minimal.       [facility]

## 2021-08-21 NOTE — Anesthesia Procedure Notes (Signed)
Procedure Name: MAC Date/Time: 08/21/2021 2:27 PM Performed by: Jerrye Noble, CRNA Pre-anesthesia Checklist: Patient identified, Emergency Drugs available, Suction available and Patient being monitored Oxygen Delivery Method: Nasal cannula

## 2021-08-21 NOTE — Anesthesia Postprocedure Evaluation (Signed)
Anesthesia Post Note  Patient: Michelle Warren  Procedure(s) Performed: COLONOSCOPY  Patient location during evaluation: Endoscopy Anesthesia Type: General Level of consciousness: awake and alert Pain management: pain level controlled Vital Signs Assessment: post-procedure vital signs reviewed and stable Respiratory status: spontaneous breathing, nonlabored ventilation, respiratory function stable and patient connected to nasal cannula oxygen Cardiovascular status: blood pressure returned to baseline and stable Postop Assessment: no apparent nausea or vomiting Anesthetic complications: no   No notable events documented.   Last Vitals:  Vitals:   08/21/21 1520 08/21/21 1530  BP:    Pulse: 76 73  Resp:    Temp:    SpO2: 100% 100%    Last Pain:  Vitals:   08/21/21 1530  TempSrc:   PainSc: 0-No pain                 Arita Miss

## 2021-08-21 NOTE — Interval H&P Note (Signed)
History and Physical Interval Note: Preprocedure H&P from 08/21/21  was reviewed and there was no interval change after seeing and examining the patient.  Written consent was obtained from the patient after discussion of risks, benefits, and alternatives. Patient has consented to proceed with Colonoscopy with possible intervention   08/21/2021 2:19 PM  Michelle Warren  has presented today for surgery, with the diagnosis of CHRONIC CONSTIPATION.  The various methods of treatment have been discussed with the patient and family. After consideration of risks, benefits and other options for treatment, the patient has consented to  Procedure(s): COLONOSCOPY (N/A) as a surgical intervention.  The patient's history has been reviewed, patient examined, no change in status, stable for surgery.  I have reviewed the patient's chart and labs.  Questions were answered to the patient's satisfaction.     [name]

## 2021-08-21 NOTE — Transfer of Care (Signed)
Immediate Anesthesia Transfer of Care Note  Patient: Michelle Warren  Procedure(s) Performed: COLONOSCOPY  Patient Location: PACU and Endoscopy Unit  Anesthesia Type:General  Level of Consciousness: awake, drowsy and patient cooperative  Airway & Oxygen Therapy: Patient Spontanous Breathing  Post-op Assessment: Report given to RN and Post -op Vital signs reviewed and stable  Post vital signs: Reviewed and stable  Last Vitals:  Vitals Value Taken Time  BP 106/58 08/21/21 1508  Temp    Pulse 238 08/21/21 1508  Resp 21 08/21/21 1508  SpO2 88 % 08/21/21 1508  Vitals shown include unvalidated device data.  Last Pain:  Vitals:   08/21/21 1319  TempSrc: Temporal  PainSc: 0-No pain         Complications: No notable events documented.

## 2021-08-22 ENCOUNTER — Encounter: Payer: Self-pay | Admitting: Gastroenterology

## 2021-08-24 LAB — SURGICAL PATHOLOGY

## 2021-08-25 LAB — SURGICAL PATHOLOGY

## 2021-11-20 ENCOUNTER — Other Ambulatory Visit: Payer: Self-pay | Admitting: Internal Medicine

## 2021-11-20 ENCOUNTER — Other Ambulatory Visit: Payer: Self-pay | Admitting: Obstetrics and Gynecology

## 2021-11-24 ENCOUNTER — Other Ambulatory Visit: Payer: Self-pay | Admitting: Obstetrics and Gynecology

## 2021-11-24 DIAGNOSIS — N644 Mastodynia: Secondary | ICD-10-CM

## 2021-11-24 DIAGNOSIS — Z1231 Encounter for screening mammogram for malignant neoplasm of breast: Secondary | ICD-10-CM

## 2021-12-01 ENCOUNTER — Encounter: Payer: Self-pay | Admitting: Cardiovascular Disease

## 2021-12-01 ENCOUNTER — Other Ambulatory Visit (INDEPENDENT_AMBULATORY_CARE_PROVIDER_SITE_OTHER): Payer: Medicare HMO

## 2021-12-01 ENCOUNTER — Ambulatory Visit: Payer: Medicare HMO | Attending: Cardiovascular Disease | Admitting: Cardiovascular Disease

## 2021-12-01 VITALS — BP 132/82 | HR 62 | Ht 64.0 in | Wt 128.0 lb

## 2021-12-01 DIAGNOSIS — R0789 Other chest pain: Secondary | ICD-10-CM

## 2021-12-01 DIAGNOSIS — R002 Palpitations: Secondary | ICD-10-CM

## 2021-12-01 MED ORDER — METOPROLOL TARTRATE 50 MG PO TABS: ORAL_TABLET | ORAL | 0 refills | Status: DC

## 2021-12-01 NOTE — Patient Instructions (Addendum)
Medication Instructions:  Your physician recommends that you continue on your current medications as directed. Please refer to the Current Medication list given to you today.  *If you need a refill on your cardiac medications before your next appointment, please call your pharmacy*   Lab Work: Bmp- today   If you have labs (blood work) drawn today and your tests are completely normal, you will receive your results only by: [place] (if you have MyChart) OR A paper copy in the mail If you have any lab test that is abnormal or we need to change your treatment, we will call you to review the results.   Testing/Procedures: Your physician has ordered for you to have a cardiac ct   A zio monitor was ordered today. It will remain on for 14 days. You will then return monitor and event diary in provided box. It takes 1-2 weeks for report to be downloaded and returned to [place]. We will call you with the results. If monitor falls off or has orange flashing light, please call [employer] for further instructions.     Follow-Up: Follow up after testing is completed   Other Instructions   Your cardiac CT will be scheduled at one of the below locations:   [facility] [address] [place], [place] [address] [phone]   If scheduled at [facility] or [facility], please arrive 15 mins early for check-in and test prep.   Please follow these instructions carefully (unless otherwise directed):  On the Night Before the Test: Be sure to Drink plenty of water. Do not consume any caffeinated/decaffeinated beverages or chocolate 12 hours prior to your test. Do not take any antihistamines 12 hours prior to your test.  On the Day of the Test: Drink plenty of water until 1 hour prior to the test. You may take your regular medications prior to the test.  Take metoprolol (Lopressor) two hours prior to  test. FEMALES- please wear underwire-free bra if available, avoid dresses & tight clothing      After the Test: Drink plenty of water. After receiving IV contrast, you may experience a mild flushed feeling. This is normal. On occasion, you may experience a mild rash up to 24 hours after the test. This is not dangerous. If this occurs, you can take Benadryl 25 mg and increase your fluid intake. If you experience trouble breathing, this can be serious. If it is severe call 911 IMMEDIATELY. If it is mild, please call our office. If you take any of these medications: Glipizide/Metformin, Avandament, Glucavance, please do not take 48 hours after completing test unless otherwise instructed.  We will call to schedule your test 2-4 weeks out understanding that some insurance companies will need an authorization prior to the service being performed.   For non-scheduling related questions, please contact the cardiac imaging nurse navigator should you have any questions/concerns: Marchia Bond, Cardiac Imaging Nurse Navigator Gordy Clement, Cardiac Imaging Nurse Navigator [facility] Direct Office Dial: [phone]   For scheduling needs, including cancellations and rescheduling, please call [place], [phone].    ZIO XT- Long Term Monitor Instructions  Your physician has requested you wear a ZIO patch monitor for 14 days.  This is a single patch monitor. [employer] supplies one patch monitor per enrollment. Additional stickers are not available. Please do not apply patch if you will be having a Nuclear Stress Test,  Echocardiogram, Cardiac CT, MRI, or  Chest Xray during the period you would be wearing the  monitor. The patch cannot be worn during these tests. You cannot remove and re-apply the  ZIO XT patch monitor.  Your ZIO patch monitor will be mailed 3 day USPS to your address on file. It may take 3-5 days  to receive your monitor after you have been enrolled.   Once you have received your monitor, please review the enclosed instructions. Your monitor  has already been registered assigning a specific monitor serial # to you.  Billing and Patient Assistance Program Information  We have supplied [employer] with any of your insurance information on file for billing purposes. [employer] offers a sliding scale Patient Assistance Program for patients that do not have  insurance, or whose insurance does not completely cover the cost of the ZIO monitor.  You must apply for the Patient Assistance Program to qualify for this discounted rate.  To apply, please call [employer] at [phone], select option 4, select option 2, ask to apply for  Patient Assistance Program. Theodore Demark will ask your household income, and how many people  are in your household. They will quote your out-of-pocket cost based on that information.  [employer] will also be able to set up a 39-month interest-free payment plan if needed.  Applying the monitor   Shave hair from upper left chest.  Hold abrader disc by orange tab. Rub abrader in 40 strokes over the upper left chest as  indicated in your monitor instructions.  Clean area with 4 enclosed alcohol pads. Let dry.  Apply patch as indicated in monitor instructions. Patch will be placed under collarbone on left  side of chest with arrow pointing upward.  Rub patch adhesive wings for 2 minutes. Remove white label marked "1". Remove the white  label marked "2". Rub patch adhesive wings for 2 additional minutes.  While looking in a mirror, press and release button in center of patch. A small green light will  flash 3-4 times. This will be your only indicator that the monitor has been turned on.  Do not shower for the first 24 hours. You may shower after the first 24 hours.  Press the button if you feel a symptom. You will hear a small click. Record Date, Time and  Symptom in the Patient Logbook.  When you are ready to remove the patch, follow  instructions on the last 2 pages of Patient  Logbook. Stick patch monitor onto the last page of Patient Logbook.  Place Patient Logbook in the blue and white box. Use locking tab on box and tape box closed  securely. The blue and white box has prepaid postage on it. Please place it in the mailbox as  soon as possible. Your physician should have your test results approximately 7 days after the  monitor has been [facility]  Call [place] [phone] you have questions regarding  your ZIO XT patch monitor. Call them immediately if you see an orange light blinking on your  monitor.  If your monitor falls off in less than 4 days, contact our Monitor department at [phone]  If your monitor becomes loose or falls off after 4 days call [employer] at [phone]  suggestions on securing your monitor

## 2021-12-01 NOTE — Progress Notes (Signed)
Cardiology Office Note   Date:  12/01/2021   ID:  Michelle Warren, DOB Oct 12, 1950, MRN [id number]  PCP:  Adin Hector, MD  Cardiologist:   Kathlyn Sacramento, MD   Chief Complaint  Patient presents with   NEW patient-palpitations    Patient also reports intermittent chest pain over the last year.      History of Present Illness: Michelle Warren is a 71 y.o. female who presents for evaluation of chest pain and palpitations.  She has no prior cardiac history.  Overall she has been relatively healthy with no history of diabetes, hypertension, hyperlipidemia or tobacco use.  She does report family history of premature coronary artery disease.  Her mother had myocardial infarction at the age of 34 and subsequently had a stroke.  She was a smoker. She is here for evaluation of substernal chest pain and tightness that happens mostly in the setting of fluttering sensation in her chest that last for few minutes.  These episodes usually happen at rest but also can happen with exertion.  They are associated with some shortness of breath but no dizziness, syncope or presyncope.  She has no thyroid disease.  She consumes 1 cup of coffee daily.  There is some stress at home.  Her daughter is getting married later this week.  No prior cardiac work-up.    Past Medical History:  Diagnosis Date   Basal cell carcinoma 04/11/2019   L sup nasolabial lat to nasal ala. Nodular. MOHs 06/04/19   Bladder prolapse, female, acquired    Chicken pox    Dysplastic nevus 12/19/2007   Right proximal lat. dorsum finger. Slight atypia, margins involved.    Dysplastic nevus 12/19/2007   Right sup. lat. scapula. Slight to moderate atypia, close to margin.   Dysplastic nevus 07/15/2008   Left lateral breast. Slight atypia, edges free.   Dysplastic nevus 07/15/2008   Right mastoid-postauricular. Slight atypia, edges free.   Dysplastic nevus 07/15/2008   Right top of shoulder, lat. Slight atypia, edges free.    GERD (gastroesophageal reflux disease)    Headache    Hemorrhoids    Meningioma (HCC)    Migraines    Squamous cell carcinoma of skin 02/06/2020   Right glabella. WD SCC with superficial infiltration. MOHs 04/18/20   Squamous cell carcinoma of skin 05/14/2020   Right cheek (in situ), EDC   Vertigo     Past Surgical History:  Procedure Laterality Date   ABDOMINAL HYSTERECTOMY     bladder tack     COLONOSCOPY N/A 08/21/2021   Procedure: COLONOSCOPY;  Surgeon: [name], DO;  Location: [facility] ENDOSCOPY;  Service: Gastroenterology;  Laterality: N/A;   COLONOSCOPY WITH PROPOFOL N/A 12/22/2016   Procedure: COLONOSCOPY WITH PROPOFOL;  Surgeon: Toledo, Benay Pike, MD;  Location: [facility] ENDOSCOPY;  Service: Gastroenterology;  Laterality: N/A;   COLONOSCOPY WITH PROPOFOL N/A 03/17/2021   Procedure: COLONOSCOPY WITH PROPOFOL;  Surgeon: Lesly Rubenstein, MD;  Location: [facility] ENDOSCOPY;  Service: Endoscopy;  Laterality: N/A;   COLONOSCOPY WITH PROPOFOL N/A 08/20/2021   Procedure: COLONOSCOPY WITH PROPOFOL;  Surgeon: [name], DO;  Location: [facility] ENDOSCOPY;  Service: Gastroenterology;  Laterality: N/A;   ESOPHAGOGASTRODUODENOSCOPY     ESOPHAGOGASTRODUODENOSCOPY (EGD) WITH PROPOFOL N/A 03/17/2021   Procedure: ESOPHAGOGASTRODUODENOSCOPY (EGD) WITH PROPOFOL;  Surgeon: Lesly Rubenstein, MD;  Location: [facility] ENDOSCOPY;  Service: Endoscopy;  Laterality: N/A;   ESOPHAGOGASTRODUODENOSCOPY (EGD) WITH PROPOFOL N/A 08/20/2021   Procedure: ESOPHAGOGASTRODUODENOSCOPY (EGD) WITH PROPOFOL;  Surgeon: Virgina Jock,  Richardo Hanks, DO;  Location: [facility] ENDOSCOPY;  Service: Gastroenterology;  Laterality: N/A;   repair enterocele vaginal     SALPINGECTOMY Bilateral    SINUS EXPLORATION     TUBAL LIGATION       Current Outpatient Medications  Medication Sig Dispense Refill   albuterol (VENTOLIN HFA) 108 (90 Base) MCG/ACT inhaler Inhale into the lungs every 6 (six) hours as needed for wheezing or  shortness of breath.     Azelastine HCl 137 MCG/SPRAY SOLN Place into the nose 2 (two) times daily as needed.     baclofen (LIORESAL) 10 MG tablet Take 10 mg by mouth as needed for muscle spasms.     diphenhydrAMINE (BENADRYL) 25 mg capsule Take 25 mg by mouth every 6 (six) hours as needed.     docusate sodium (COLACE) 100 MG capsule Take 100 mg by mouth 2 (two) times daily as needed for mild constipation.     eletriptan (RELPAX) 40 MG tablet Take 40 mg by mouth as needed for migraine or headache. May repeat in 2 hours if headache persists or recurs.     Erenumab-aooe 140 MG/ML SOAJ Inject into the skin every 28 (twenty-eight) days.     gabapentin (NEURONTIN) 600 MG tablet Take 600 mg by mouth at bedtime.     hydrocortisone (ANUSOL-HC) 2.5 % rectal cream Place 1 application  rectally 2 (two) times daily as needed.     ibuprofen (ADVIL,MOTRIN) 600 MG tablet Take 1 tablet (600 mg total) by mouth every 8 (eight) hours as needed. 30 tablet 0   lactulose (CHRONULAC) 10 GM/15ML solution Take by mouth daily as needed for mild constipation.     meloxicam (MOBIC) 15 MG tablet Take 15 mg by mouth daily as needed.     pantoprazole (PROTONIX) 40 MG tablet SMARTSIG:1 Tablet(s) By Mouth Morning-Night     prochlorperazine (COMPAZINE) 10 MG tablet Take 1 tablet (10 mg total) by mouth every 8 (eight) hours as needed for nausea or vomiting. 15 tablet 0   sertraline (ZOLOFT) 25 MG tablet Take 25 mg by mouth daily.     topiramate (TOPAMAX) 200 MG tablet Take 200 mg by mouth 2 (two) times daily.     Urea 39 % CREA Apply 1 application topically daily. 227 g 5   valACYclovir (VALTREX) 500 MG tablet TAKE 1 TABLET BY MOUTH TWICE DAILY IF NEEDED FOR FLARE     Wheat Dextrin (BENEFIBER PO) Take by mouth daily.     zolpidem (AMBIEN) 5 MG tablet Take 5 mg by mouth at bedtime as needed for sleep.     meclizine (ANTIVERT) 12.5 MG tablet TAKE 1 2 TABLETS (12.5 25 MG TOTAL) BY MOUTH 3 (THREE) TIMES DAILY AS NEEDED (MOTION  SICKNESS) (Patient not taking: Reported on 12/01/2021)     No current facility-administered medications for this visit.    Allergies:   Macrobid [nitrofurantoin macrocrystal] and Trazodone    Social History:  The patient  reports that she has never smoked. She has never used smokeless tobacco. She reports that she does not drink alcohol and does not use drugs.   Family History:  The patient's family history includes CAD in her mother; Glaucoma in her maternal grandmother; Heart attack in her mother; Migraines in her sister; Stroke in her mother; Thyroid disease in her sister.    ROS:  Please see the history of present illness.   Otherwise, review of systems are positive for none.   All other systems are reviewed and negative.  PHYSICAL EXAM: VS:  BP 132/82 (BP Location: Right Arm, Patient Position: Sitting, Cuff Size: Normal)   Pulse 62   Ht '5\' 4"'$  (1.626 m)   Wt 128 lb (58.1 kg)   SpO2 99%   BMI 21.97 kg/m  , BMI Body mass index is 21.97 kg/m. GEN: Well nourished, well developed, in no acute distress  HEENT: normal  Neck: no JVD, carotid bruits, or masses Cardiac: RRR; no murmurs, rubs, or gallops,no edema  Respiratory:  clear to auscultation bilaterally, normal work of breathing GI: soft, nontender, nondistended, + BS MS: no deformity or atrophy  Skin: warm and dry, no rash Neuro:  Strength and sensation are intact Psych: euthymic mood, full affect   EKG:  EKG is ordered today. The ekg ordered today demonstrates normal sinus rhythm with low voltage and no significant ST or T wave changes.   Recent Labs: No results found for requested labs within last 365 days.    Lipid Panel No results found for: "CHOL", "TRIG", "HDL", "CHOLHDL", "VLDL", "LDLCALC", "LDLDIRECT"    Wt Readings from Last 3 Encounters:  12/01/21 128 lb (58.1 kg)  08/21/21 128 lb 12.8 oz (58.4 kg)  08/20/21 132 lb (59.9 kg)           12/01/2021    3:25 PM  PAD Screen  Previous PAD dx? No   Previous surgical procedure? Yes  Pain with walking? Yes  Subsides with rest? No  Feet/toe relief with dangling? Yes  Painful, non-healing ulcers? No  Extremities discolored? No      ASSESSMENT AND PLAN:  1.  Chest pain with some anginal features: Some of her symptoms are concerning for possible angina.  She has family history of premature coronary artery disease.  I recommend evaluation with cardiac CTA.  She is not allergic to contrast.  2.  Intermittent palpitations: Most of her episodes of chest pain is in the setting of palpitations and tachycardia.  I requested a 2-week ZIO monitor.    Disposition:   FU after cardiac testing.  Signed,  Kathlyn Sacramento, MD  12/01/2021 3:52 PM    [facility]

## 2021-12-08 ENCOUNTER — Other Ambulatory Visit: Payer: Medicare HMO

## 2021-12-08 ENCOUNTER — Inpatient Hospital Stay: Admission: RE | Admit: 2021-12-08 | Payer: Medicare HMO | Source: Ambulatory Visit

## 2021-12-16 ENCOUNTER — Telehealth: Payer: Self-pay | Admitting: Cardiovascular Disease

## 2021-12-16 NOTE — Telephone Encounter (Signed)
Investigated why patient has not received monitor.  Patient was placed on [place] schedule and arrived , but her order was never completed by [facility] begun.  I changed the order to Home instead of Clinic, since it was not applied in the office. I informed patient regarding error and correction and told her she should receive her monitor in a day or two.   Patient was concerned that there was not enough time between her CCTA and mammogram for the 14 day monitor.  She mentioned she had a follow up with Dr. Fletcher Anon in the beginning of November and she was also going out of town for a wedding 11/3 .  I did not see an appointment for a follow up scheduled. Patient stated she even wrote the follow up appointment down. I informed patient she could apply the monitor at her convenience.  I will speak to our clinical manager regarding error and appointment discrepancy so she may communicate the above with the [place] office.

## 2021-12-16 NOTE — Progress Notes (Unsigned)
Enrolled for [employer] to mail a ZIO XT long term holter monitor to the patients address on file.  

## 2021-12-16 NOTE — Telephone Encounter (Signed)
Pt is requesting call back in regards to heart monitor that was suppose to be ordered. She states that she hasn't received a call or the heart monitor and would like to know when she would need to wear this due to up coming tests and appts elsewhere. Please advise.

## 2021-12-16 NOTE — Telephone Encounter (Signed)
Patient stated she has not received Zio to wear for 2 weeks. This was ordered on 9/12. She already rescheduled her mammogram and is due for CCTA on 10/2. She wants to know if she can wait until after her exams to wear the monitor. She felt a brief "flutter" yesterday and is asymptomatic. She is staying hydrated and avoiding flutter triggers. She is due to go out of state on 11/3. Please advise on monitor wear.

## 2021-12-17 NOTE — Telephone Encounter (Signed)
Not sure what's going on here.

## 2021-12-18 ENCOUNTER — Telehealth (HOSPITAL_COMMUNITY): Payer: Self-pay | Admitting: *Deleted

## 2021-12-18 NOTE — Telephone Encounter (Signed)
Reaching out to patient to offer assistance regarding upcoming cardiac imaging study; pt verbalizes understanding of appt date/time, parking situation and where to check in, medications ordered, and verified current allergies; name and call back number provided for further questions should they arise  Gordy Clement RN Navigator Cardiac Imaging [facility] [phone] office [phone] cell  Patient to take '25mg'$  metoprolol tartrate two hours prior to her cardiac CT scan.

## 2021-12-21 ENCOUNTER — Ambulatory Visit
Admission: RE | Admit: 2021-12-21 | Discharge: 2021-12-21 | Disposition: A | Discharge status: Home / Self Care | Payer: Medicare HMO | Source: Ambulatory Visit | Attending: Obstetrics and Gynecology | Admitting: Obstetrics and Gynecology

## 2021-12-21 ENCOUNTER — Ambulatory Visit
Admission: RE | Admit: 2021-12-21 | Discharge: 2021-12-21 | Disposition: A | Discharge status: Home / Self Care | Payer: Medicare HMO | Source: Ambulatory Visit | Attending: Cardiovascular Disease | Admitting: Cardiovascular Disease

## 2021-12-21 DIAGNOSIS — Z1231 Encounter for screening mammogram for malignant neoplasm of breast: Secondary | ICD-10-CM | POA: Insufficient documentation

## 2021-12-21 DIAGNOSIS — R0789 Other chest pain: Secondary | ICD-10-CM | POA: Insufficient documentation

## 2021-12-21 DIAGNOSIS — N644 Mastodynia: Secondary | ICD-10-CM

## 2021-12-21 LAB — POCT I-STAT CREATININE: Creatinine, Ser: 1 mg/dL (ref 0.44–1.00)

## 2021-12-21 MED ORDER — NITROGLYCERIN 0.4 MG SL SUBL: 0.8000 mg | SUBLINGUAL_TABLET | Freq: Once | SUBLINGUAL | Status: DC

## 2021-12-21 NOTE — Progress Notes (Signed)
Not able to proceed with CT scan due to pt poor vasculature. Pt stuck by RN and CT techs with non success. Pt agreeable to reschedule scan.

## 2021-12-22 DIAGNOSIS — R002 Palpitations: Secondary | ICD-10-CM | POA: Diagnosis not present

## 2021-12-25 ENCOUNTER — Telehealth (HOSPITAL_COMMUNITY): Payer: Self-pay | Admitting: Emergency Medicine

## 2021-12-25 NOTE — Telephone Encounter (Signed)
Reaching out to patient to offer assistance regarding upcoming cardiac imaging study; pt verbalizes understanding of appt date/time, parking situation and where to check in, pre-test NPO status and medications ordered, and verified current allergies; name and call back number provided for further questions should they arise Marchia Bond RN Navigator Cardiac Imaging [facility] [phone] office [phone] cell  IV TEAM CONSULT Arrival 1230  '25mg'$  metoprolol tartrate PO HYDRATION

## 2021-12-28 ENCOUNTER — Ambulatory Visit
Admission: RE | Admit: 2021-12-28 | Discharge: 2021-12-28 | Disposition: A | Discharge status: Home / Self Care | Payer: Medicare HMO | Source: Ambulatory Visit | Attending: Cardiovascular Disease | Admitting: Cardiovascular Disease

## 2021-12-28 DIAGNOSIS — R0602 Shortness of breath: Secondary | ICD-10-CM | POA: Insufficient documentation

## 2021-12-28 DIAGNOSIS — R0789 Other chest pain: Secondary | ICD-10-CM | POA: Insufficient documentation

## 2021-12-28 DIAGNOSIS — I209 Angina pectoris, unspecified: Secondary | ICD-10-CM | POA: Diagnosis not present

## 2021-12-28 MED ORDER — IOHEXOL 350 MG/ML SOLN
100.0000 mL | Freq: Once | INTRAVENOUS | Status: AC | PRN
  Administered 2021-12-28: 100 mL via INTRAVENOUS

## 2021-12-28 MED ORDER — NITROGLYCERIN 0.4 MG SL SUBL
0.8000 mg | SUBLINGUAL_TABLET | Freq: Once | SUBLINGUAL | Status: AC
  Administered 2021-12-28: 0.8 mg via SUBLINGUAL

## 2021-12-28 NOTE — Progress Notes (Signed)
Patient tolerated procedure well. Ambulate w/o difficulty. Denies any lightheadedness or being dizzy. Pt denies any pain at this time. Sitting in chair, pt given water to drink and encouraged to drink additional water throughout the day and reason explained to patient. Patient verbalized understanding and all questions answered. ABC intact. No further needs at this time. Discharge from procedure area w/o issues.

## 2021-12-30 ENCOUNTER — Other Ambulatory Visit: Payer: Medicare HMO

## 2022-01-04 ENCOUNTER — Encounter: Payer: Self-pay | Admitting: Dermatology

## 2022-01-04 ENCOUNTER — Ambulatory Visit: Payer: Medicare HMO | Admitting: Dermatology

## 2022-01-04 DIAGNOSIS — Z1283 Encounter for screening for malignant neoplasm of skin: Secondary | ICD-10-CM

## 2022-01-04 DIAGNOSIS — L570 Actinic keratosis: Secondary | ICD-10-CM

## 2022-01-04 DIAGNOSIS — D229 Melanocytic nevi, unspecified: Secondary | ICD-10-CM

## 2022-01-04 DIAGNOSIS — L821 Other seborrheic keratosis: Secondary | ICD-10-CM

## 2022-01-04 DIAGNOSIS — L820 Inflamed seborrheic keratosis: Secondary | ICD-10-CM

## 2022-01-04 DIAGNOSIS — Z8589 Personal history of malignant neoplasm of other organs and systems: Secondary | ICD-10-CM

## 2022-01-04 DIAGNOSIS — L853 Xerosis cutis: Secondary | ICD-10-CM

## 2022-01-04 DIAGNOSIS — L814 Other melanin hyperpigmentation: Secondary | ICD-10-CM

## 2022-01-04 DIAGNOSIS — Z86018 Personal history of other benign neoplasm: Secondary | ICD-10-CM

## 2022-01-04 DIAGNOSIS — Z85828 Personal history of other malignant neoplasm of skin: Secondary | ICD-10-CM

## 2022-01-04 DIAGNOSIS — L578 Other skin changes due to chronic exposure to nonionizing radiation: Secondary | ICD-10-CM

## 2022-01-04 DIAGNOSIS — L988 Other specified disorders of the skin and subcutaneous tissue: Secondary | ICD-10-CM

## 2022-01-04 NOTE — Patient Instructions (Addendum)
Cryotherapy Aftercare  Wash gently with soap and water everyday.   Apply Vaseline daily until healed.   Recommend moisturizing with CeraVe cream.   Gentle Skin Care Guide  1. Bathe no more than once a day.  2. Avoid bathing in hot water  3. Use a mild soap like Dove, Vanicream, Cetaphil, CeraVe. Can use Lever 2000 or Cetaphil antibacterial soap  4. Use soap only where you need it. On most days, use it under your arms, between your legs, and on your feet. Let the water rinse other areas unless visibly dirty.  5. When you get out of the bath/shower, use a towel to gently blot your skin dry, don't rub it.  6. While your skin is still a little damp, apply a moisturizing cream such as Vanicream, CeraVe, Cetaphil, Eucerin, Sarna lotion or plain Vaseline Jelly. For hands apply Neutrogena [place] Hand Cream or Excipial Hand Cream.  7. Reapply moisturizer any time you start to itch or feel dry.  8. Sometimes using free and clear laundry detergents can be helpful. Fabric softener sheets should be avoided. Downy Free & Gentle liquid, or any liquid fabric softener that is free of dyes and perfumes, it acceptable to use  9. If your doctor has given you prescription creams you may apply moisturizers over them     Recommend daily broad spectrum sunscreen SPF 30+ to sun-exposed areas, reapply every 2 hours as needed. Call for new or changing lesions.  Staying in the shade or wearing long sleeves, sun glasses (UVA+UVB protection) and wide brim hats (4-inch brim around the entire circumference of the hat) are also recommended for sun protection.    Melanoma ABCDEs  Melanoma is the most dangerous type of skin cancer, and is the leading cause of death from skin disease.  You are more likely to develop melanoma if you: Have light-colored skin, light-colored eyes, or red or blond hair Spend a lot of time in the sun Tan regularly, either outdoors or in a tanning bed Have had blistering sunburns,  especially during childhood Have a close family member who has had a melanoma Have atypical moles or large birthmarks  Early detection of melanoma is key since treatment is typically straightforward and cure rates are extremely high if we catch it early.   The first sign of melanoma is often a change in a mole or a new dark spot.  The ABCDE system is a way of remembering the signs of melanoma.  A for asymmetry:  The two halves do not match. B for border:  The edges of the growth are irregular. C for color:  A mixture of colors are present instead of an even brown color. D for diameter:  Melanomas are usually (but not always) greater than 44m - the size of a pencil eraser. E for evolution:  The spot keeps changing in size, shape, and color.  Please check your skin once per month between visits. You can use a small mirror in front and a large mirror behind you to keep an eye on the back side or your body.   If you see any new or changing lesions before your next follow-up, please call to schedule a visit.  Please continue daily skin protection including broad spectrum sunscreen SPF 30+ to sun-exposed areas, reapplying every 2 hours as needed when you're outdoors.   Staying in the shade or wearing long sleeves, sun glasses (UVA+UVB protection) and wide brim hats (4-inch brim around the entire circumference of the hat)  are also recommended for sun protection.    Due to recent changes in healthcare laws, you may see results of your pathology and/or laboratory studies on MyChart before the doctors have had a chance to review them. We understand that in some cases there may be results that are confusing or concerning to you. Please understand that not all results are received at the same time and often the doctors may need to interpret multiple results in order to provide you with the best plan of care or course of treatment. Therefore, we ask that you please give [place] 2 business days to thoroughly  review all your results before contacting the office for clarification. Should we see a critical lab result, you will be contacted sooner.   If You Need Anything After Your Visit  If you have any questions or concerns for your doctor, please call our main line at [phone] and press option 4 to reach your doctor's medical assistant. If no one answers, please leave a voicemail as directed and we will return your call as soon as possible. Messages left after 4 pm will be answered the following business day.   You may also send [place] a message via [place]. We typically respond to MyChart messages within 1-2 business days.  For prescription refills, please ask your pharmacy to contact our office. Our fax number is [phone].  If you have an urgent issue when the clinic is closed that cannot wait until the next business day, you can page your doctor at the number below.    Please note that while we do our best to be available for urgent issues outside of office hours, we are not available 24/7.   If you have an urgent issue and are unable to reach [place], you may choose to seek medical care at your doctor's office, retail clinic, urgent care center, or emergency room.  If you have a medical emergency, please immediately call 911 or go to the emergency department.  Pager Numbers  - Dr. Nehemiah Massed: [phone]  - Dr. Laurence Ferrari: [phone]  - Dr. Nicole Kindred: [phone]  In the event of inclement weather, please call our main line at [phone] for an update on the status of any delays or closures.  Dermatology Medication Tips: Please keep the boxes that topical medications come in in order to help keep track of the instructions about where and how to use these. Pharmacies typically print the medication instructions only on the boxes and not directly on the medication tubes.   If your medication is too expensive, please contact our office at [phone] option 4 or send [place] a message through  [place].   We are unable to tell what your co-pay for medications will be in advance as this is different depending on your insurance coverage. However, we may be able to find a substitute medication at lower cost or fill out paperwork to get insurance to cover a needed medication.   If a prior authorization is required to get your medication covered by your insurance company, please allow [place] 1-2 business days to complete this process.  Drug prices often vary depending on where the prescription is filled and some pharmacies may offer cheaper prices.  The website www.goodrx.com contains coupons for medications through different pharmacies. The prices here do not account for what the cost may be with help from insurance (it may be cheaper with your insurance), but the website can give you the price if you did not use any insurance.  -  You can print the associated coupon and take it with your prescription to the pharmacy.  - You may also stop by our office during regular business hours and pick up a GoodRx coupon card.  - If you need your prescription sent electronically to a different pharmacy, notify our office through [facility] or by phone at [phone] option 4.     Si Usted Necesita Algo Despus de Su Visita  Tambin puede enviarnos un mensaje a travs de Pharmacist, community. Por lo general respondemos a los mensajes de MyChart en el transcurso de 1 a 2 das hbiles.  Para renovar recetas, por favor pida a su farmacia que se ponga en contacto con nuestra oficina. Harland Dingwall de fax es [place] [phone].  Si tiene un asunto urgente cuando la clnica est cerrada y que no puede esperar hasta el siguiente da hbil, puede llamar/localizar a su doctor(a) al nmero que aparece a continuacin.   Por favor, tenga en cuenta que aunque hacemos todo lo posible para estar disponibles para asuntos urgentes fuera del horario de [place], no estamos disponibles las 24 horas del da, los 7 das de la  Between.   Si tiene un problema urgente y no puede comunicarse con nosotros, puede optar por buscar atencin mdica  en el consultorio de su doctor(a), en una clnica privada, en un centro de atencin urgente o en una sala de emergencias.  Si tiene Engineering geologist, por favor llame inmediatamente al 911 o vaya a la sala de emergencias.  Nmeros de bper  - Dr. Nehemiah Massed: [phone]  - Dra. Moye: [phone]  - Dra. Nicole Kindred: [phone]  En caso de inclemencias del [place], por favor llame a Johnsie Kindred principal al [phone] para una actualizacin sobre el [place] de cualquier retraso o cierre.  Consejos para la medicacin en dermatologa: Por favor, guarde las cajas en las que vienen los medicamentos de uso tpico para ayudarle a seguir las instrucciones sobre dnde y cmo usarlos. Las farmacias generalmente imprimen las instrucciones del medicamento slo en las cajas y no directamente en los tubos del [place].   Si su medicamento es muy caro, por favor, pngase en contacto con Zigmund Daniel llamando al [phone] y presione la opcin 4 o envenos un mensaje a travs de Pharmacist, community.   No podemos decirle cul ser su copago por los medicamentos por adelantado ya que esto es diferente dependiendo de la cobertura de su seguro. Sin embargo, es posible que podamos encontrar un medicamento sustituto a Electrical engineer un formulario para que el seguro cubra el medicamento que se considera necesario.   Si se requiere una autorizacin previa para que su compaa de seguros [place] su medicamento, por favor permtanos de 1 a 2 das hbiles para completar este proceso.  Los precios de los medicamentos varan con frecuencia dependiendo del Environmental consultant de dnde se surte la receta y alguna farmacias pueden ofrecer precios ms baratos.  El sitio web www.goodrx.com tiene cupones para medicamentos de Airline pilot. Los precios aqu no tienen en cuenta lo que podra costar con la ayuda del  seguro (puede ser ms barato con su seguro), pero el sitio web puede darle el precio si no utiliz Research scientist (physical sciences).  - Puede imprimir el cupn correspondiente y llevarlo con su receta a la farmacia.  - Tambin puede pasar por nuestra oficina durante el horario de atencin regular y Charity fundraiser una tarjeta de cupones de GoodRx.  - Si necesita que su receta se enve electrnicamente a una farmacia diferente, informe  a nuestra oficina a travs de MyChart de [place] o por telfono llamando al [phone] y presione la opcin 4.

## 2022-01-04 NOTE — Progress Notes (Signed)
Follow-Up Visit   Subjective  Michelle Warren is a 71 y.o. female who presents for the following: Annual Exam (HxSCC's, BCC, dysplastic nevi. Used 5FU/Calcipotriene cream as directed after last visit ). The patient presents for Total-Body Skin Exam (TBSE) for skin cancer screening and mole check.  The patient has spots, moles and lesions to be evaluated, some may be new or changing and the patient has concerns that these could be cancer.  The following portions of the chart were reviewed this encounter and updated as appropriate:  Tobacco  Allergies  Meds  Problems  Med Hx  Surg Hx  Fam Hx     Review of Systems: No other skin or systemic complaints except as noted in HPI or Assessment and Plan.  Objective  Well appearing patient in no apparent distress; mood and affect are within normal limits.  A full examination was performed including scalp, head, eyes, ears, nose, lips, neck, chest, axillae, abdomen, back, buttocks, bilateral upper extremities, bilateral lower extremities, hands, feet, fingers, toes, fingernails, and toenails. All findings within normal limits unless otherwise noted below.  forehead x1 Erythematous thin papules/macules with gritty scale.   right neck x1 Erythematous keratotic or waxy stuck-on papule or plaque.  face Rhytides and volume loss.    Assessment & Plan   History of Basal Cell Carcinoma of the Skin - No evidence of recurrence today - Recommend regular full body skin exams - Recommend daily broad spectrum sunscreen SPF 30+ to sun-exposed areas, reapply every 2 hours as needed.  - Call if any new or changing lesions are noted between office visits  History of Squamous Cell Carcinoma of the Skin - No evidence of recurrence today - No lymphadenopathy - Recommend regular full body skin exams - Recommend daily broad spectrum sunscreen SPF 30+ to sun-exposed areas, reapply every 2 hours as needed.  - Call if any new or changing lesions are noted  between office visits  History of Dysplastic Nevi - No evidence of recurrence today - Recommend regular full body skin exams - Recommend daily broad spectrum sunscreen SPF 30+ to sun-exposed areas, reapply every 2 hours as needed.  - Call if any new or changing lesions are noted between office visits   Lentigines - Scattered tan macules - Due to sun exposure - Benign-appearing, observe - Recommend daily broad spectrum sunscreen SPF 30+ to sun-exposed areas, reapply every 2 hours as needed. - Call for any changes  Seborrheic Keratoses - Stuck-on, waxy, tan-brown papules and/or plaques  - Benign-appearing - Discussed benign etiology and prognosis. - Observe - Call for any changes  Melanocytic Nevi - Tan-brown and/or pink-flesh-colored symmetric macules and papules - Benign appearing on exam today - Observation - Call clinic for new or changing moles - Recommend daily use of broad spectrum spf 30+ sunscreen to sun-exposed areas.   Hemangiomas - Red papules - Discussed benign nature - Observe - Call for any changes  Actinic Damage - Chronic condition, secondary to cumulative UV/sun exposure - diffuse scaly erythematous macules with underlying dyspigmentation - Recommend daily broad spectrum sunscreen SPF 30+ to sun-exposed areas, reapply every 2 hours as needed.  - Staying in the shade or wearing long sleeves, sun glasses (UVA+UVB protection) and wide brim hats (4-inch brim around the entire circumference of the hat) are also recommended for sun protection.  - Call for new or changing lesions.  Skin cancer screening performed today.  Xerosis - diffuse xerotic patches - recommend gentle, hydrating skin care - gentle skin  care handout given - Recommend using CeraVe moisturizing cream  AK (actinic keratosis) forehead x1 Actinic keratoses are precancerous spots that appear secondary to cumulative UV radiation exposure/sun exposure over time. They are chronic with expected  duration over 1 year. A portion of actinic keratoses will progress to squamous cell carcinoma of the skin. It is not possible to reliably predict which spots will progress to skin cancer and so treatment is recommended to prevent development of skin cancer.  Recommend daily broad spectrum sunscreen SPF 30+ to sun-exposed areas, reapply every 2 hours as needed.  Recommend staying in the shade or wearing long sleeves, sun glasses (UVA+UVB protection) and wide brim hats (4-inch brim around the entire circumference of the hat). Call for new or changing lesions.  Destruction of lesion - forehead x1 Complexity: simple   Destruction method: cryotherapy   Informed consent: discussed and consent obtained   Timeout:  patient name, date of birth, surgical site, and procedure verified Lesion destroyed using liquid nitrogen: Yes   Region frozen until ice ball extended beyond lesion: Yes   Outcome: patient tolerated procedure well with no complications   Post-procedure details: wound care instructions given   Additional details:  Prior to procedure, discussed risks of blister formation, small wound, skin dyspigmentation, or rare scar following cryotherapy. Recommend Vaseline ointment to treated areas while healing.   Inflamed seborrheic keratosis right neck x1 Symptomatic, irritating, patient would like treated. Destruction of lesion - right neck x1 Complexity: simple   Destruction method: cryotherapy   Informed consent: discussed and consent obtained   Timeout:  patient name, date of birth, surgical site, and procedure verified Lesion destroyed using liquid nitrogen: Yes   Region frozen until ice ball extended beyond lesion: Yes   Outcome: patient tolerated procedure well with no complications   Post-procedure details: wound care instructions given   Additional details:  Prior to procedure, discussed risks of blister formation, small wound, skin dyspigmentation, or rare scar following cryotherapy.  Recommend Vaseline ointment to treated areas while healing.   Elastosis of skin face Discussed fillers today.  Return for TBSE, HxBCC, HxSCC, HxDN in 6-12 months.  I, Emelia Salisbury, CMA, am acting as scribe for Sarina Ser, MD. Documentation: I have reviewed the above documentation for accuracy and completeness, and I agree with the above.  Sarina Ser, MD

## 2022-01-11 ENCOUNTER — Encounter: Payer: Self-pay | Admitting: *Deleted

## 2022-01-12 ENCOUNTER — Telehealth: Payer: Self-pay | Admitting: Cardiovascular Disease

## 2022-01-12 NOTE — Telephone Encounter (Signed)
Patient returned for her CT results.

## 2022-01-13 NOTE — Telephone Encounter (Signed)
Attempted to reach the patient. Letter of results have been mailed.

## 2022-01-16 ENCOUNTER — Encounter: Payer: Self-pay | Admitting: Dermatology

## 2022-01-19 ENCOUNTER — Other Ambulatory Visit: Payer: Self-pay | Admitting: Student

## 2022-01-19 DIAGNOSIS — J329 Chronic sinusitis, unspecified: Secondary | ICD-10-CM

## 2022-02-15 ENCOUNTER — Other Ambulatory Visit: Payer: Self-pay | Admitting: *Deleted

## 2022-02-15 MED ORDER — METOPROLOL SUCCINATE ER 25 MG PO TB24: 25.0000 mg | ORAL_TABLET | Freq: Every day | ORAL | 1 refills | Status: DC

## 2022-03-02 ENCOUNTER — Telehealth: Payer: Self-pay | Admitting: Cardiovascular Disease

## 2022-03-02 NOTE — Telephone Encounter (Signed)
Pt c/o medication issue:  1. Name of Medication: metoprolol succinate (TOPROL-XL) 25 MG 24 hr tablet   2. How are you currently taking this medication (dosage and times per day)?   3. Are you having a reaction (difficulty breathing--STAT)?   4. What is your medication issue? Pt states she took this medication for 8 week but she started feeling extremely dizzy more than usual and nauseous. Pt discontinued this medication herself due to her symptoms and wants know if there is something else she can take. She states she also noticed weight gain when taking this medication so she would like something that doesn't cause that.

## 2022-03-02 NOTE — Telephone Encounter (Signed)
Spoke with the patient who stated that the Metoprolol did not work for her. She started it 8 days ago and stated that she was feeling dizzy and nauseous so will not take it anymore.   She would like to know what she can take in place of that.

## 2022-03-04 NOTE — Telephone Encounter (Signed)
Patient calling back to follow up.

## 2022-03-05 MED ORDER — DILTIAZEM HCL ER COATED BEADS 120 MG PO CP24: 120.0000 mg | ORAL_CAPSULE | Freq: Every day | ORAL | 3 refills | Status: DC

## 2022-03-05 NOTE — Telephone Encounter (Signed)
Let's switch to Diltazem CD 120 mg once daily

## 2022-03-05 NOTE — Telephone Encounter (Signed)
The patient has been made aware. Diltiazem 120 mg once daily has been sent in.

## 2022-03-19 ENCOUNTER — Telehealth: Payer: Self-pay | Admitting: Cardiovascular Disease

## 2022-03-19 NOTE — Telephone Encounter (Signed)
No need to start another medication.  She did not do very well with metoprolol either.  Her arrhythmia is not serious.  She can monitor her symptoms for now and let us know if her palpitations worsen.

## 2022-03-19 NOTE — Telephone Encounter (Signed)
Pt c/o medication issue:  1. Name of Medication: diltiazem (CARDIZEM CD) 120 MG 24 hr capsule    2. How are you currently taking this medication (dosage and times per day)? Take 1 capsule (120 mg total) by mouth daily. - Oral   3. Are you having a reaction (difficulty breathing--STAT)?   4. What is your medication issue? 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 Please advise on what else pt can take.

## 2022-03-19 NOTE — Telephone Encounter (Signed)
Patient reports that she started the cardizem CD on the 14th and has been causing her significant pain due to her neuropathy. She states that it has been affecting from her thighs all the way down. She reports that she has stopped the medication and will not take take it and would like to know if she should try something else. Advised that we will send this message over to her doctor for review and any further recommendations. She verbalized understanding with no further questions at this time.

## 2022-03-19 NOTE — Telephone Encounter (Signed)
Spoke with patient and reviewed provider recommendations. She verbalized understanding with no further questions at this time.

## 2022-05-13 ENCOUNTER — Other Ambulatory Visit: Payer: Self-pay | Admitting: Student

## 2022-05-13 ENCOUNTER — Encounter: Payer: Self-pay | Admitting: Student

## 2022-05-13 DIAGNOSIS — J329 Chronic sinusitis, unspecified: Secondary | ICD-10-CM

## 2022-05-13 DIAGNOSIS — J323 Chronic sphenoidal sinusitis: Secondary | ICD-10-CM

## 2022-05-20 ENCOUNTER — Ambulatory Visit
Admission: RE | Admit: 2022-05-20 | Discharge: 2022-05-20 | Disposition: A | Discharge status: Home / Self Care | Payer: Medicare HMO | Source: Ambulatory Visit | Attending: Student | Admitting: Student

## 2022-05-20 DIAGNOSIS — J323 Chronic sphenoidal sinusitis: Secondary | ICD-10-CM

## 2022-05-28 ENCOUNTER — Ambulatory Visit: Payer: Medicare HMO | Admitting: Cardiovascular Disease

## 2022-06-08 ENCOUNTER — Other Ambulatory Visit: Payer: Self-pay | Admitting: Neurology

## 2022-06-08 DIAGNOSIS — R519 Headache, unspecified: Secondary | ICD-10-CM

## 2022-06-21 ENCOUNTER — Ambulatory Visit
Admission: RE | Admit: 2022-06-21 | Discharge: 2022-06-21 | Disposition: A | Discharge status: Home / Self Care | Payer: Medicare HMO | Source: Ambulatory Visit | Attending: Neurology | Admitting: Neurology

## 2022-06-21 DIAGNOSIS — R519 Headache, unspecified: Secondary | ICD-10-CM | POA: Insufficient documentation

## 2022-06-21 MED ORDER — GADOBUTROL 1 MMOL/ML IV SOLN
5.0000 mL | Freq: Once | INTRAVENOUS | Status: AC | PRN
  Administered 2022-06-21: 5 mL via INTRAVENOUS

## 2022-08-11 ENCOUNTER — Other Ambulatory Visit: Payer: Self-pay | Admitting: Cardiovascular Disease

## 2022-09-12 IMAGING — MR MR ABDOMEN WO/W CM
19 series · 48 of 48 positions shown · IV contrast (13 mL Multihance)
Comparison: Abdominal ultrasound dated June 24, 2021 and CT abdomen
pelvis dated August 04, 2016

CLINICAL DATA: Further evaluation of hepatic lesions seen on prior
ultrasound.

EXAM:
MRI ABDOMEN WITHOUT AND WITH CONTRAST
TECHNIQUE: Multiplanar multisequence MR imaging of the abdomen was performed
both before and after the administration of intravenous contrast.
CONTRAST:  13mL MULTIHANCE GADOBENATE DIMEGLUMINE 529 MG/ML IV SOLN

[Series 2: T2 · coronal · 5.0mm · 1.56mm/px · 1 of 35 slices shown (1 of 2)]
[im 1/35]
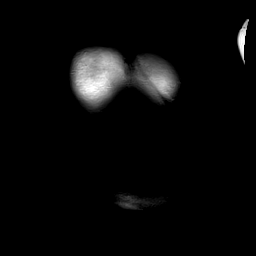

[Series 3: T2 fat-sat · axial · 5.0mm · 1.48mm/px · 1 of 35 slices shown]
[im 1/35]
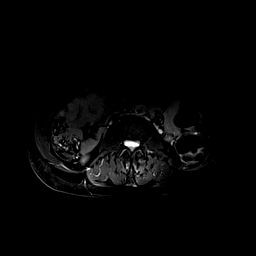

[Series 4: T1 · axial · 5.5mm · 0.74mm/px · z∈[-51,+173]mm · 3 of 70 slices shown]
[im 1/70]
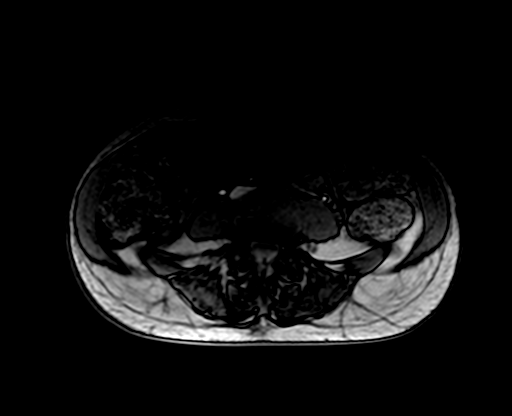
[im 35/70]
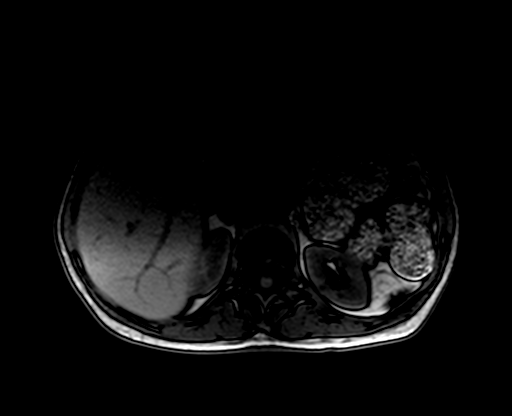
[im 70/70]
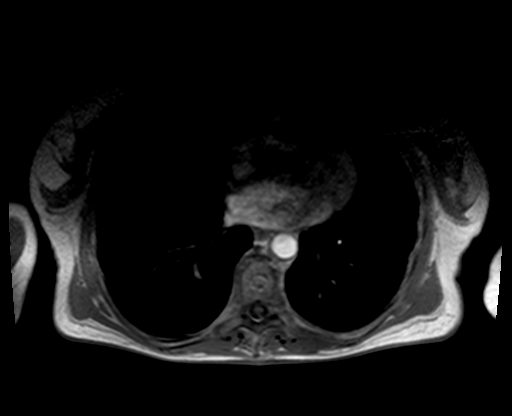

[Series 5: DWI · axial · 5.0mm · 1.42mm/px · z∈[-48,+180]mm · 4 of 117 slices shown (1 of 2)]
[im 1/117]
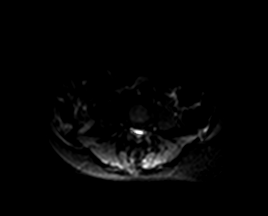
[im 39/117]
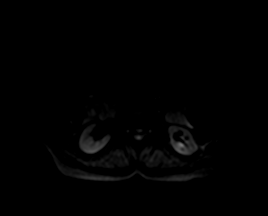
[im 78/117]
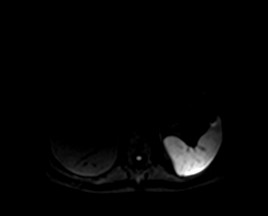
[im 117/117]
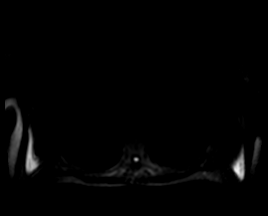

[Series 6: DWI · axial · 5.0mm · 1.42mm/px · 1 of 39 slices shown (2 of 2)]
[im 1/39]
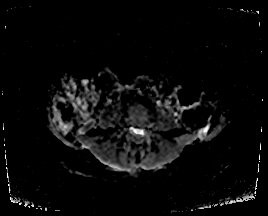

[Series 7: bSSFP · axial · 5.5mm · 1.25mm/px · 1 of 38 slices shown]
[im 1/38]
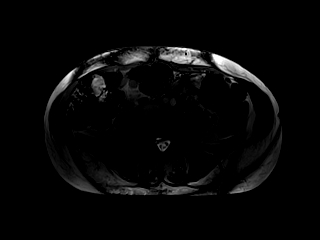

[Series 8: T1 dynamic · axial · non-contrast · 3.0mm · 1.25mm/px · z∈[-57,+180]mm · 3 of 80 slices shown]
[im 1/80]
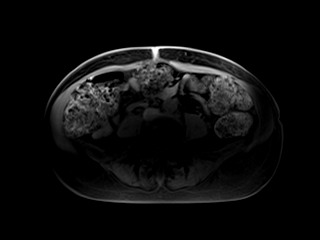
[im 40/80]
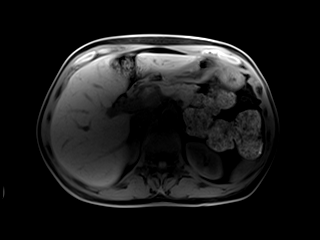
[im 80/80]
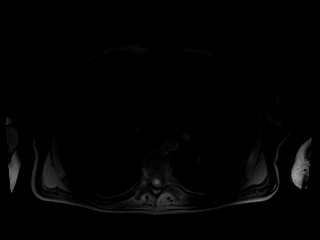

[Series 9: T1 dynamic post-contrast · axial · 3.0mm · 1.25mm/px · z∈[-57,+180]mm · 3 of 80 slices shown (1 of 11)]
[im 1/80]
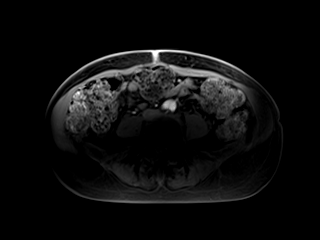
[im 40/80]
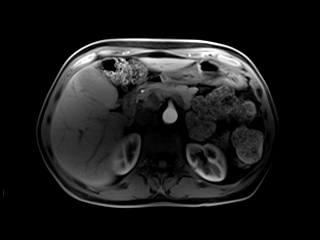
[im 80/80]
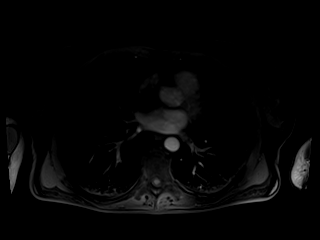

[Series 10: T1 dynamic post-contrast · axial · 3.0mm · 1.25mm/px · z∈[-57,+180]mm · 3 of 80 slices shown (2 of 11)]
[im 1/80]
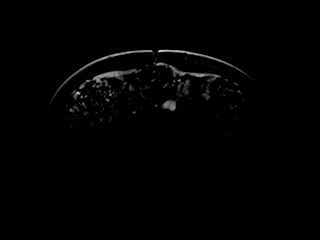
[im 40/80]
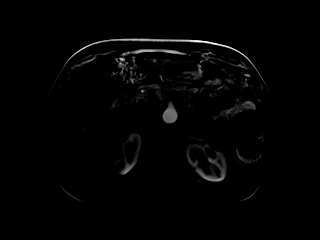
[im 80/80]
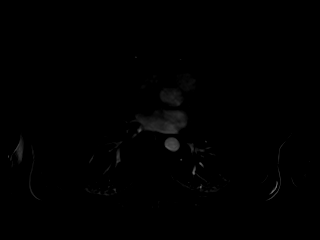

[Series 11: T1 dynamic post-contrast · axial · 3.0mm · 1.25mm/px · z∈[-57,+180]mm · 3 of 80 slices shown (3 of 11)]
[im 1/80]
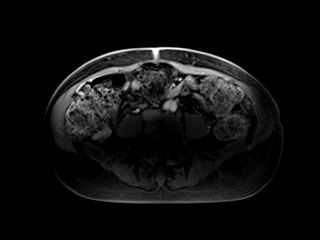
[im 40/80]
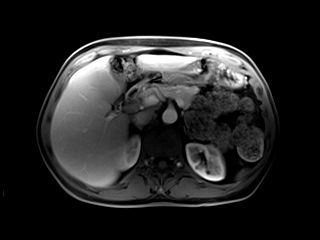
[im 80/80]
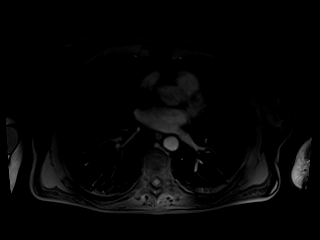

[Series 12: T1 dynamic post-contrast · axial · 3.0mm · 1.25mm/px · z∈[-57,+180]mm · 3 of 80 slices shown (4 of 11)]
[im 1/80]
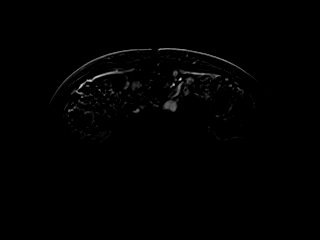
[im 40/80]
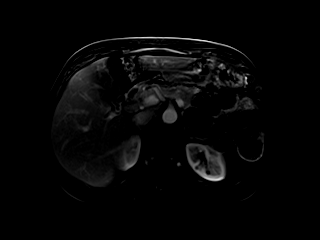
[im 80/80]
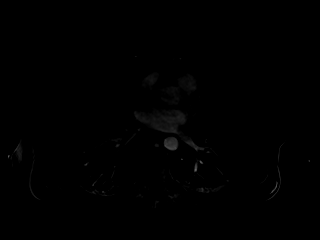

[Series 13: T1 dynamic post-contrast · axial · 3.0mm · 1.25mm/px · z∈[-57,+180]mm · 3 of 80 slices shown (5 of 11)]
[im 1/80]
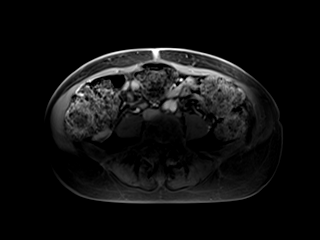
[im 40/80]
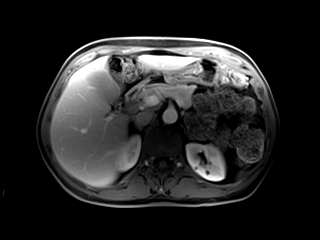
[im 80/80]
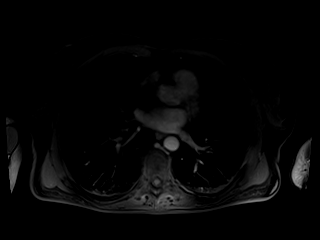

[Series 14: T1 dynamic post-contrast · axial · 3.0mm · 1.25mm/px · z∈[-57,+180]mm · 3 of 80 slices shown (6 of 11)]
[im 1/80]
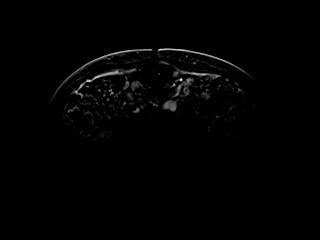
[im 40/80]
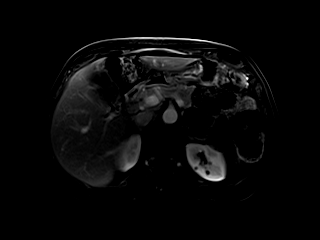
[im 80/80]
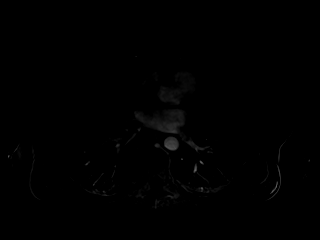

[Series 15: T1 dynamic post-contrast · coronal · 3.0mm · 1.25mm/px · 3 of 72 slices shown (7 of 11)]
[im 1/72]
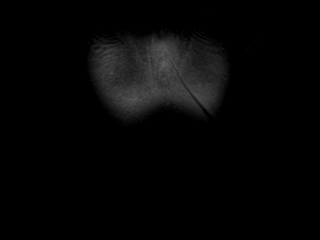
[im 36/72]
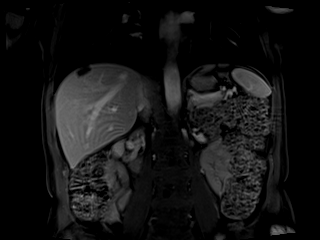
[im 72/72]
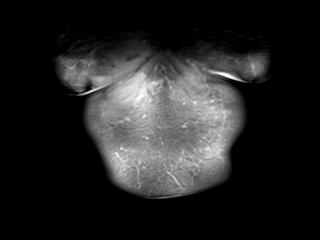

[Series 16: T2 · axial · 6.6mm · 1.19mm/px · 1 of 33 slices shown (2 of 2)]
[im 1/33]
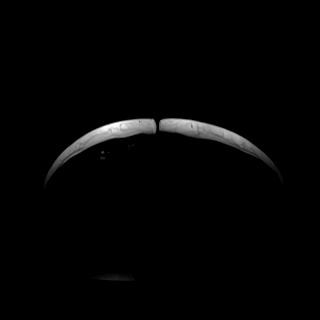

[Series 17: T1 dynamic post-contrast · axial · 3.0mm · 1.25mm/px · z∈[-57,+180]mm · 3 of 80 slices shown (8 of 11)]
[im 1/80]
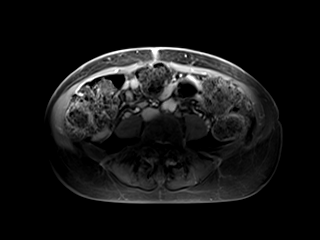
[im 40/80]
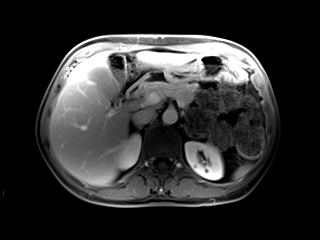
[im 80/80]
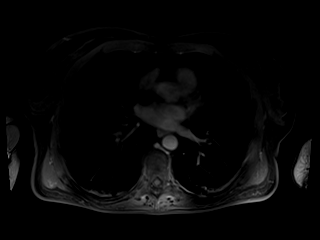

[Series 18: T1 dynamic post-contrast · axial · 3.0mm · 1.25mm/px · z∈[-57,+180]mm · 3 of 80 slices shown (9 of 11)]
[im 1/80]
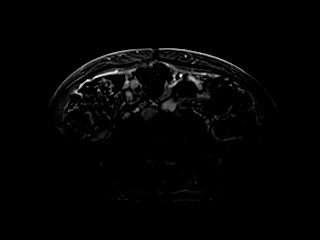
[im 40/80]
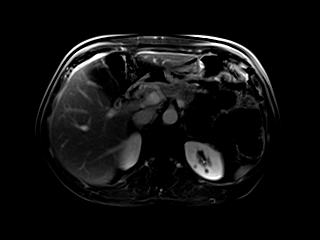
[im 80/80]
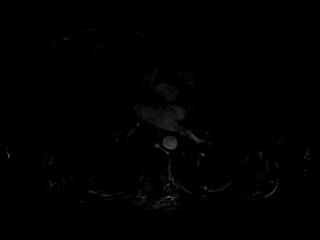

[Series 19: T1 dynamic post-contrast · axial · 3.0mm · 1.25mm/px · z∈[-57,+180]mm · 3 of 80 slices shown (10 of 11)]
[im 1/80]
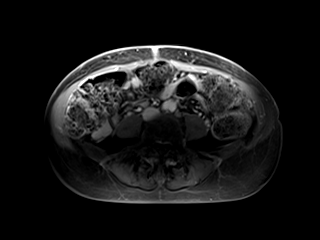
[im 40/80]
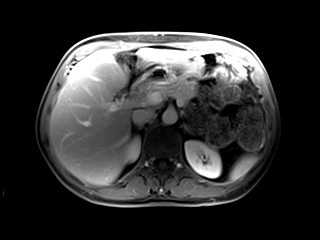
[im 80/80]
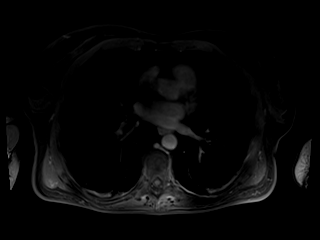

[Series 20: T1 dynamic post-contrast · axial · 3.0mm · 1.25mm/px · z∈[-57,+180]mm · 3 of 80 slices shown (11 of 11)]
[im 1/80]
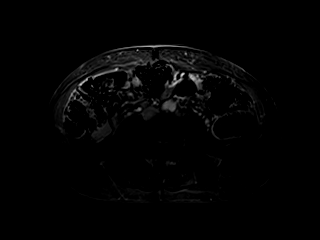
[im 40/80]
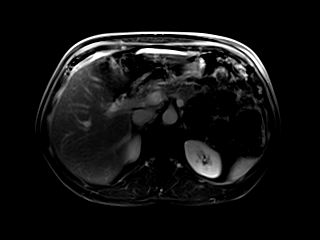
[im 80/80]
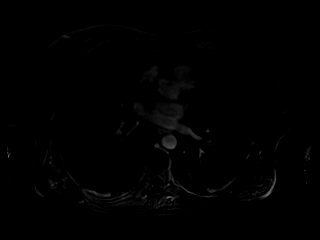

[48 of 48 positions shown; findings below may reference images not displayed]

FINDINGS: Lower chest: No acute abnormality.

Hepatobiliary: Lobular well-circumscribed 18 mm fluid signal lesion
in the hepatic dome on image [DATE] does not demonstrate suspicious
postcontrast enhancement, previously measured 15 mm on CT August 04, 2016 and corresponds with the lesion seen on prior ultrasound, is
consistent with a benign hepatic cyst. No suspicious hepatic lesion.
Gallbladder is distended without wall thickening or pericholecystic
fluid.

Pancreas: The intrinsic T1 signal of the pancreatic parenchyma is
within normal limits. No pancreatic ductal dilation or evidence of
acute inflammation. No cystic or hyperenhancing solid pancreatic
lesion identified.

Spleen:  No splenomegaly or focal splenic lesion.

Adrenals/Urinary Tract: Bilateral adrenal glands appear normal. No
hydronephrosis. No solid enhancing renal mass. Tiny bilateral renal
cysts are consistent with a benign etiology and require no
independent follow-up.

Stomach/Bowel: Large volume of formed stool in the visualized colon.
No evidence of acute bowel inflammation or obstruction.

Vascular/Lymphatic: Normal caliber abdominal aorta. The portal,
splenic and superior mesenteric veins are patent. No abdominal
adenopathy.

Other:  No abdominal free fluid.

Musculoskeletal: No suspicious bone lesions identified.
IMPRESSION: 1. Lobular well-circumscribed 18 mm fluid signal lesion in the
hepatic dome does not demonstrate suspicious postcontrast
enhancement, previously measured 15 mm on CT August 04, 2016 and
corresponds with the lesion seen on prior ultrasound, is consistent
with a benign hepatic cyst.
2. No suspicious hepatic lesion.
3. Large volume of formed stool in the visualized colon. Correlate
for constipation.

## 2022-10-18 ENCOUNTER — Encounter: Payer: Self-pay | Admitting: Gastroenterology

## 2022-10-25 ENCOUNTER — Encounter: Payer: Self-pay | Admitting: Gastroenterology

## 2022-10-28 ENCOUNTER — Encounter: Payer: Self-pay | Admitting: Gastroenterology

## 2022-11-01 ENCOUNTER — Other Ambulatory Visit: Payer: Self-pay

## 2022-11-01 ENCOUNTER — Ambulatory Visit: Payer: Medicare HMO | Admitting: Anesthesiology

## 2022-11-01 ENCOUNTER — Encounter
Admission: RE | Disposition: A | Discharge status: Home / Self Care | Payer: Self-pay | Source: Ambulatory Visit | Attending: Gastroenterology

## 2022-11-01 ENCOUNTER — Ambulatory Visit
Admission: RE | Admit: 2022-11-01 | Discharge: 2022-11-01 | Disposition: A | Discharge status: Home / Self Care | Payer: Medicare HMO | Source: Ambulatory Visit | Attending: Gastroenterology | Admitting: Gastroenterology

## 2022-11-01 DIAGNOSIS — Z538 Procedure and treatment not carried out for other reasons: Secondary | ICD-10-CM | POA: Diagnosis not present

## 2022-11-01 DIAGNOSIS — Z09 Encounter for follow-up examination after completed treatment for conditions other than malignant neoplasm: Secondary | ICD-10-CM | POA: Insufficient documentation

## 2022-11-01 DIAGNOSIS — Z86010 Personal history of colonic polyps: Secondary | ICD-10-CM | POA: Insufficient documentation

## 2022-11-01 HISTORY — DX: Unspecified thoracic, thoracolumbar and lumbosacral intervertebral disc disorder: M51.9

## 2022-11-01 HISTORY — DX: Insomnia, unspecified: G47.00

## 2022-11-01 HISTORY — DX: Chondromalacia patellae, left knee: M22.42

## 2022-11-01 HISTORY — DX: Other shoulder lesions, unspecified shoulder: M75.80

## 2022-11-01 HISTORY — DX: Atherosclerosis of aorta: I70.0

## 2022-11-01 HISTORY — DX: Atherosclerotic heart disease of native coronary artery without angina pectoris: I25.10

## 2022-11-01 HISTORY — DX: Chronic kidney disease, unspecified: N18.9

## 2022-11-01 HISTORY — PX: COLONOSCOPY WITH PROPOFOL: SHX5780

## 2022-11-01 HISTORY — DX: Spondylosis without myelopathy or radiculopathy, lumbar region: M47.816

## 2022-11-01 HISTORY — DX: Cervical disc disorder, unspecified, unspecified cervical region: M50.90

## 2022-11-01 SURGERY — COLONOSCOPY WITH PROPOFOL
Anesthesia: General

## 2022-11-01 MED ORDER — SODIUM CHLORIDE 0.9 % IV SOLN: INTRAVENOUS | Status: DC

## 2022-11-01 NOTE — H&P (Signed)
Unfortunately, despite her best efforts, pt still with liquid/mushy brown bowel movements.  She reports the lactulose has helped and she is having a BM 3 times a week. Occasionally using ducolax as well.  Given her h/o poor preps and current brown stools, we will defer today's exam to a later date.  Enis Slipper, DO [facility]

## 2022-11-01 NOTE — OR Nursing (Signed)
Cancelled in preop has not clean still having brown stools

## 2022-11-02 ENCOUNTER — Encounter: Payer: Self-pay | Admitting: Gastroenterology

## 2023-01-03 DIAGNOSIS — R7303 Prediabetes: Secondary | ICD-10-CM | POA: Insufficient documentation

## 2023-01-06 ENCOUNTER — Ambulatory Visit: Payer: Medicare HMO | Admitting: Dermatology

## 2023-01-06 ENCOUNTER — Encounter: Payer: Self-pay | Admitting: Dermatology

## 2023-01-06 DIAGNOSIS — L814 Other melanin hyperpigmentation: Secondary | ICD-10-CM

## 2023-01-06 DIAGNOSIS — Z85828 Personal history of other malignant neoplasm of skin: Secondary | ICD-10-CM

## 2023-01-06 DIAGNOSIS — L578 Other skin changes due to chronic exposure to nonionizing radiation: Secondary | ICD-10-CM | POA: Diagnosis not present

## 2023-01-06 DIAGNOSIS — Z86018 Personal history of other benign neoplasm: Secondary | ICD-10-CM

## 2023-01-06 DIAGNOSIS — L821 Other seborrheic keratosis: Secondary | ICD-10-CM

## 2023-01-06 DIAGNOSIS — L820 Inflamed seborrheic keratosis: Secondary | ICD-10-CM

## 2023-01-06 DIAGNOSIS — L570 Actinic keratosis: Secondary | ICD-10-CM | POA: Diagnosis not present

## 2023-01-06 DIAGNOSIS — Z1283 Encounter for screening for malignant neoplasm of skin: Secondary | ICD-10-CM

## 2023-01-06 DIAGNOSIS — D492 Neoplasm of unspecified behavior of bone, soft tissue, and skin: Secondary | ICD-10-CM | POA: Diagnosis not present

## 2023-01-06 DIAGNOSIS — W908XXA Exposure to other nonionizing radiation, initial encounter: Secondary | ICD-10-CM | POA: Diagnosis not present

## 2023-01-06 DIAGNOSIS — D229 Melanocytic nevi, unspecified: Secondary | ICD-10-CM

## 2023-01-06 DIAGNOSIS — L928 Other granulomatous disorders of the skin and subcutaneous tissue: Secondary | ICD-10-CM | POA: Diagnosis not present

## 2023-01-06 DIAGNOSIS — Z8589 Personal history of malignant neoplasm of other organs and systems: Secondary | ICD-10-CM

## 2023-01-06 DIAGNOSIS — D1801 Hemangioma of skin and subcutaneous tissue: Secondary | ICD-10-CM

## 2023-01-06 NOTE — Progress Notes (Signed)
Follow-Up Visit   Subjective  Michelle Warren is a 72 y.o. female who presents for the following: Skin Cancer Screening and Full Body Skin Exam. Hx of BCCs, SCCs, and dysplastic nevi.   Concerned with spot on right side of neck. Was burning. Pink, scaly.  The patient presents for Total-Body Skin Exam (TBSE) for skin cancer screening and mole check. The patient has spots, moles and lesions to be evaluated, some may be new or changing and the patient may have concern these could be cancer.    The following portions of the chart were reviewed this encounter and updated as appropriate: medications, allergies, medical history  Review of Systems:  No other skin or systemic complaints except as noted in HPI or Assessment and Plan.  Objective  Well appearing patient in no apparent distress; mood and affect are within normal limits.  A full examination was performed including scalp, head, eyes, ears, nose, lips, neck, chest, axillae, abdomen, back, buttocks, bilateral upper extremities, bilateral lower extremities, hands, feet, fingers, toes, fingernails, and toenails. All findings within normal limits unless otherwise noted below.   Relevant physical exam findings are noted in the Assessment and Plan.  Right Neck x1, left nose x2, right nose x1, left zygoma x1 (5) Erythematous thin papules/macules with gritty scale. Pink scaly patch at right neck         Left Temple x4, right back x1 (5) Erythematous keratotic or waxy stuck-on papule or plaque.  medial right great toe Crusted papule, 0.6 cm         Assessment & Plan   SKIN CANCER SCREENING PERFORMED TODAY.  ACTINIC DAMAGE - Chronic condition, secondary to cumulative UV/sun exposure - diffuse scaly erythematous macules with underlying dyspigmentation - Recommend daily broad spectrum sunscreen SPF 30+ to sun-exposed areas, reapply every 2 hours as needed.  - Staying in the shade or wearing long sleeves, sun glasses  (UVA+UVB protection) and wide brim hats (4-inch brim around the entire circumference of the hat) are also recommended for sun protection.  - Call for new or changing lesions.  LENTIGINES, SEBORRHEIC KERATOSES, HEMANGIOMAS - Benign normal skin lesions - Benign-appearing - Call for any changes  MELANOCYTIC NEVI - Tan-brown and/or pink-flesh-colored symmetric macules and papules - Benign appearing on exam today - Observation - Call clinic for new or changing moles - Recommend daily use of broad spectrum spf 30+ sunscreen to sun-exposed areas.   AK (actinic keratosis) (5) Right Neck x1, left nose x2, right nose x1, left zygoma x1  Actinic keratoses are precancerous spots that appear secondary to cumulative UV radiation exposure/sun exposure over time. They are chronic with expected duration over 1 year. A portion of actinic keratoses will progress to squamous cell carcinoma of the skin. It is not possible to reliably predict which spots will progress to skin cancer and so treatment is recommended to prevent development of skin cancer.  Recommend daily broad spectrum sunscreen SPF 30+ to sun-exposed areas, reapply every 2 hours as needed.  Recommend staying in the shade or wearing long sleeves, sun glasses (UVA+UVB protection) and wide brim hats (4-inch brim around the entire circumference of the hat). Call for new or changing lesions.   Recheck right neck in 6-8 months  Destruction of lesion - Right Neck x1, left nose x2, right nose x1, left zygoma x1 (5) Complexity: simple   Destruction method: cryotherapy   Informed consent: discussed and consent obtained   Timeout:  patient name, date of birth, surgical site, and procedure  verified Lesion destroyed using liquid nitrogen: Yes   Region frozen until ice ball extended beyond lesion: Yes   Outcome: patient tolerated procedure well with no complications   Post-procedure details: wound care instructions given   Additional details:  Prior  to procedure, discussed risks of blister formation, small wound, skin dyspigmentation, or rare scar following cryotherapy. Recommend Vaseline ointment to treated areas while healing.   Inflamed seborrheic keratosis (5) Left Temple x4, right back x1  Symptomatic, irritating, patient would like treated.  Destruction of lesion - Left Temple x4, right back x1 (5) Complexity: simple   Destruction method: cryotherapy   Informed consent: discussed and consent obtained   Timeout:  patient name, date of birth, surgical site, and procedure verified Lesion destroyed using liquid nitrogen: Yes   Region frozen until ice ball extended beyond lesion: Yes   Outcome: patient tolerated procedure well with no complications   Post-procedure details: wound care instructions given   Additional details:  Prior to procedure, discussed risks of blister formation, small wound, skin dyspigmentation, or rare scar following cryotherapy. Recommend Vaseline ointment to treated areas while healing.   Neoplasm of skin medial right great toe  Epidermal / dermal shaving  Lesion diameter (cm):  0.6 Informed consent: discussed and consent obtained   Timeout: patient name, date of birth, surgical site, and procedure verified   Procedure prep:  Patient was prepped and draped in usual sterile fashion Prep type:  Isopropyl alcohol Anesthesia: the lesion was anesthetized in a standard fashion   Anesthetic:  1% lidocaine w/ epinephrine 1-100,000 buffered w/ 8.4% NaHCO3 Instrument used: flexible razor blade   Hemostasis achieved with: pressure, aluminum chloride and electrodesiccation   Outcome: patient tolerated procedure well   Post-procedure details: sterile dressing applied and wound care instructions given   Dressing type: bandage and petrolatum    Specimen 1 - Surgical pathology Differential Diagnosis: traumatic injury vs other  Check Margins: No   Return for AK Follow Up, Sun Exposed exam.  I, Lawson Radar,  CMA, am acting as scribe for Armida Sans, MD.   Documentation: I have reviewed the above documentation for accuracy and completeness, and I agree with the above.  Armida Sans, MD

## 2023-01-06 NOTE — Patient Instructions (Addendum)
Cryotherapy Aftercare  Wash gently with soap and water everyday.   Apply Vaseline Jelly daily until healed.    Wound Care Instructions  Cleanse wound gently with soap and water once a day then pat dry with clean gauze. Apply a thin coat of Petrolatum (petroleum jelly, "Vaseline") over the wound (unless you have an allergy to this). We recommend that you use a new, sterile tube of Vaseline. Do not pick or remove scabs. Do not remove the yellow or white "healing tissue" from the base of the wound.  Cover the wound with fresh, clean, nonstick gauze and secure with paper tape. You may use Band-Aids in place of gauze and tape if the wound is small enough, but would recommend trimming much of the tape off as there is often too much. Sometimes Band-Aids can irritate the skin.  You should call the office for your biopsy report after 1 week if you have not already been contacted.  If you experience any problems, such as abnormal amounts of bleeding, swelling, significant bruising, significant pain, or evidence of infection, please call the office immediately.  FOR ADULT SURGERY PATIENTS: If you need something for pain relief you may take 1 extra strength Tylenol (acetaminophen) AND 2 Ibuprofen (200mg  each) together every 4 hours as needed for pain. (do not take these if you are allergic to them or if you have a reason you should not take them.) Typically, you may only need pain medication for 1 to 3 days.      Recommend daily broad spectrum sunscreen SPF 30+ to sun-exposed areas, reapply every 2 hours as needed. Call for new or changing lesions.  Staying in the shade or wearing long sleeves, sun glasses (UVA+UVB protection) and wide brim hats (4-inch brim around the entire circumference of the hat) are also recommended for sun protection.    Melanoma ABCDEs  Melanoma is the most dangerous type of skin cancer, and is the leading cause of death from skin disease.  You are more likely to develop  melanoma if you: Have light-colored skin, light-colored eyes, or red or blond hair Spend a lot of time in the sun Tan regularly, either outdoors or in a tanning bed Have had blistering sunburns, especially during childhood Have a close family member who has had a melanoma Have atypical moles or large birthmarks  Early detection of melanoma is key since treatment is typically straightforward and cure rates are extremely high if we catch it early.   The first sign of melanoma is often a change in a mole or a new dark spot.  The ABCDE system is a way of remembering the signs of melanoma.  A for asymmetry:  The two halves do not match. B for border:  The edges of the growth are irregular. C for color:  A mixture of colors are present instead of an even brown color. D for diameter:  Melanomas are usually (but not always) greater than 6mm - the size of a pencil eraser. E for evolution:  The spot keeps changing in size, shape, and color.  Please check your skin once per month between visits. You can use a small mirror in front and a large mirror behind you to keep an eye on the back side or your body.   If you see any new or changing lesions before your next follow-up, please call to schedule a visit.  Please continue daily skin protection including broad spectrum sunscreen SPF 30+ to sun-exposed areas, reapplying every 2 hours  as needed when you're outdoors.   Staying in the shade or wearing long sleeves, sun glasses (UVA+UVB protection) and wide brim hats (4-inch brim around the entire circumference of the hat) are also recommended for sun protection.     Due to recent changes in healthcare laws, you may see results of your pathology and/or laboratory studies on MyChart before the doctors have had a chance to review them. We understand that in some cases there may be results that are confusing or concerning to you. Please understand that not all results are received at the same time and often  the doctors may need to interpret multiple results in order to provide you with the best plan of care or course of treatment. Therefore, we ask that you please give [place] 2 business days to thoroughly review all your results before contacting the office for clarification. Should we see a critical lab result, you will be contacted sooner.   If You Need Anything After Your Visit  If you have any questions or concerns for your doctor, please call our main line at [phone] and press option 4 to reach your doctor's medical assistant. If no one answers, please leave a voicemail as directed and we will return your call as soon as possible. Messages left after 4 pm will be answered the following business day.   You may also send [place] a message via MyChart. We typically respond to MyChart messages within 1-2 business days.  For prescription refills, please ask your pharmacy to contact our office. Our fax number is [phone].  If you have an urgent issue when the clinic is closed that cannot wait until the next business day, you can page your doctor at the number below.    Please note that while we do our best to be available for urgent issues outside of office hours, we are not available 24/7.   If you have an urgent issue and are unable to reach [place], you may choose to seek medical care at your doctor's office, retail clinic, urgent care center, or emergency room.  If you have a medical emergency, please immediately call 911 or go to the emergency department.  Pager Numbers  - Dr. Gwen Pounds: [phone]  - Dr. Roseanne Reno: [phone]  - Dr. Katrinka Blazing: [phone]   In the event of inclement weather, please call our main line at [phone] for an update on the status of any delays or closures.  Dermatology Medication Tips: Please keep the boxes that topical medications come in in order to help keep track of the instructions about where and how to use these. Pharmacies typically print the medication  instructions only on the boxes and not directly on the medication tubes.   If your medication is too expensive, please contact our office at [phone] option 4 or send [place] a message through MyChart.   We are unable to tell what your co-pay for medications will be in advance as this is different depending on your insurance coverage. However, we may be able to find a substitute medication at lower cost or fill out paperwork to get insurance to cover a needed medication.   If a prior authorization is required to get your medication covered by your insurance company, please allow [place] 1-2 business days to complete this process.  Drug prices often vary depending on where the prescription is filled and some pharmacies may offer cheaper prices.  The website www.goodrx.com contains coupons for medications through different pharmacies. The prices here do not account  for what the cost may be with help from insurance (it may be cheaper with your insurance), but the website can give you the price if you did not use any insurance.  - You can print the associated coupon and take it with your prescription to the pharmacy.  - You may also stop by our office during regular business hours and pick up a GoodRx coupon card.  - If you need your prescription sent electronically to a different pharmacy, notify our office through [facility] or by phone at [phone] option 4.     Si Usted Necesita Algo Despus de Su Visita  Tambin puede enviarnos un mensaje a travs de Clinical cytogeneticist. Por lo general respondemos a los mensajes de MyChart en el transcurso de 1 a 2 das hbiles.  Para renovar recetas, por favor pida a su farmacia que se ponga en contacto con nuestra oficina. Annie Sable de fax es [place] [phone].  Si tiene un asunto urgente cuando la clnica est cerrada y que no puede esperar hasta el siguiente da hbil, puede llamar/localizar a su doctor(a) al nmero que aparece a continuacin.   Por  favor, tenga en cuenta que aunque hacemos todo lo posible para estar disponibles para asuntos urgentes fuera del horario de [place], no estamos disponibles las 24 horas del da, los 7 [address]  [address] de la [place].   Si tiene un problema urgente y no puede comunicarse con nosotros, puede optar por buscar atencin mdica  en el consultorio de su doctor(a), en una clnica privada, en un centro de atencin urgente o en una sala de emergencias.  Si tiene Engineer, drilling, por favor llame inmediatamente al 911 o vaya a la sala de emergencias.  Nmeros de bper  - Dr. Gwen Pounds: [phone]  - Dra. Roseanne Reno: [phone]  - Dr. Katrinka Blazing: [phone]   En caso de inclemencias del tiempo, por favor llame a Lacy Duverney principal al [phone] para una actualizacin sobre el [place] de cualquier retraso o cierre.  Consejos para la medicacin en dermatologa: Por favor, guarde las cajas en las que vienen los medicamentos de uso tpico para ayudarle a seguir las instrucciones sobre dnde y cmo usarlos. Las farmacias generalmente imprimen las instrucciones del medicamento slo en las cajas y no directamente en los tubos del [place].   Si su medicamento es muy caro, por favor, pngase en contacto con Rolm Gala llamando al [phone] y presione la opcin 4 o envenos un mensaje a travs de Clinical cytogeneticist.   No podemos decirle cul ser su copago por los medicamentos por adelantado ya que esto es diferente dependiendo de la cobertura de su seguro. Sin embargo, es posible que podamos encontrar un medicamento sustituto a Audiological scientist un formulario para que el seguro cubra el medicamento que se considera necesario.   Si se requiere una autorizacin previa para que su compaa de seguros [place] su medicamento, por favor permtanos de 1 a 2 das hbiles para completar [address]  Los precios de los medicamentos varan con frecuencia dependiendo del Environmental consultant de dnde se surte la receta y alguna farmacias  pueden ofrecer precios ms baratos.  El sitio web www.goodrx.com tiene cupones para medicamentos de Health and safety inspector. Los precios aqu no tienen en cuenta lo que podra costar con la ayuda del seguro (puede ser ms barato con su seguro), pero el sitio web puede darle el precio si no utiliz Tourist information centre manager.  - Puede imprimir el cupn correspondiente y llevarlo con su receta a la farmacia.  -  Tambin puede pasar por nuestra oficina durante el horario de atencin regular y Education officer, museum una tarjeta de cupones de GoodRx.  - Si necesita que su receta se enve electrnicamente a una farmacia diferente, informe a nuestra oficina a travs de MyChart de [place] o por telfono llamando al [phone] y presione la opcin 4.

## 2023-01-11 LAB — SURGICAL PATHOLOGY

## 2023-01-12 ENCOUNTER — Telehealth: Payer: Self-pay

## 2023-01-12 NOTE — Telephone Encounter (Signed)
Discussed pathology results. Patient voiced understanding. JP

## 2023-01-12 NOTE — Telephone Encounter (Signed)
Patient advised by JP of bx results/sh

## 2023-01-12 NOTE — Telephone Encounter (Signed)
-----   Message from Armida Sans sent at 01/11/2023  6:05 PM EDT ----- FINAL DIAGNOSIS        1. Skin, medial right great toe :       SURFACE OF FIBROSING GRANULATION TISSUE, WITH HEMORRHAGIC SCALE CRUST   Benign granulation tissue with scab Most likely trauma related No further treatment needed

## 2023-01-25 ENCOUNTER — Encounter: Payer: Self-pay | Admitting: Podiatry

## 2023-01-25 ENCOUNTER — Ambulatory Visit: Payer: Medicare HMO | Admitting: Podiatry

## 2023-01-25 VITALS — Ht 64.0 in | Wt 128.0 lb

## 2023-01-25 DIAGNOSIS — L600 Ingrowing nail: Secondary | ICD-10-CM | POA: Diagnosis not present

## 2023-01-25 NOTE — Progress Notes (Signed)
Subjective:  Patient ID: Michelle Warren, female    DOB: 1950-10-30,  MRN: [id number]  No chief complaint on file.   72 y.o. female presents with the above complaint.  Patient presents with right fourth lateral border ingrown painful to touch is progressive and worse worse with ambulation worse with pressure she has not seen and was prior to seeing me she would like to have it removed pain scale 7 out of 10 dull aching nature   Review of Systems: Negative except as noted in the HPI. Denies N/V/F/Ch.  Past Medical History:  Diagnosis Date   Aortic atherosclerosis (HCC)    Basal cell carcinoma 04/11/2019   L sup nasolabial lat to nasal ala. Nodular. MOHs 06/04/19   Bladder prolapse, female, acquired    Cervical disc disease    Chicken pox    Chondromalacia of left patella    Chronic kidney disease    Coronary artery disease    Dysplastic nevus 12/19/2007   Right proximal lat. dorsum finger. Slight atypia, margins involved.    Dysplastic nevus 12/19/2007   Right sup. lat. scapula. Slight to moderate atypia, close to margin.   Dysplastic nevus 07/15/2008   Left lateral breast. Slight atypia, edges free.   Dysplastic nevus 07/15/2008   Right mastoid-postauricular. Slight atypia, edges free.   Dysplastic nevus 07/15/2008   Right top of shoulder, lat. Slight atypia, edges free.   GERD (gastroesophageal reflux disease)    Headache    Hemorrhoids    Insomnia    Lumbar disc disease    Lumbar spondylosis    Meningioma (HCC)    Migraines    Rotator cuff tendinitis    Squamous cell carcinoma of skin 02/06/2020   Right glabella. WD SCC with superficial infiltration. MOHs 04/18/20   Squamous cell carcinoma of skin 05/14/2020   Right cheek (in situ), EDC   Vertigo     Current Outpatient Medications:    albuterol (VENTOLIN HFA) 108 (90 Base) MCG/ACT inhaler, Inhale into the lungs every 6 (six) hours as needed for wheezing or shortness of breath., Disp: , Rfl:    Azelastine HCl 137  MCG/SPRAY SOLN, Place into the nose 2 (two) times daily as needed., Disp: , Rfl:    baclofen (LIORESAL) 10 MG tablet, Take 10 mg by mouth as needed for muscle spasms., Disp: , Rfl:    diphenhydrAMINE (BENADRYL) 25 mg capsule, Take 25 mg by mouth every 6 (six) hours as needed., Disp: , Rfl:    docusate sodium (COLACE) 100 MG capsule, Take 100 mg by mouth 2 (two) times daily as needed for mild constipation., Disp: , Rfl:    eletriptan (RELPAX) 40 MG tablet, Take 40 mg by mouth as needed for migraine or headache. May repeat in 2 hours if headache persists or recurs., Disp: , Rfl:    Erenumab-aooe 140 MG/ML SOAJ, Inject into the skin every 28 (twenty-eight) days., Disp: , Rfl:    folic acid (FOLVITE) 1 MG tablet, Take by mouth., Disp: , Rfl:    gabapentin (NEURONTIN) 600 MG tablet, Take 600 mg by mouth at bedtime., Disp: , Rfl:    hydrocortisone (ANUSOL-HC) 2.5 % rectal cream, Place 1 application  rectally 2 (two) times daily as needed., Disp: , Rfl:    hydroxychloroquine (PLAQUENIL) 200 MG tablet, Take 200 mg by mouth 2 (two) times daily., Disp: , Rfl:    ibuprofen (ADVIL,MOTRIN) 600 MG tablet, Take 1 tablet (600 mg total) by mouth every 8 (eight) hours as needed., Disp: 30 tablet,  Rfl: 0   lactulose (CHRONULAC) 10 GM/15ML solution, Take by mouth daily as needed for mild constipation., Disp: , Rfl:    meclizine (ANTIVERT) 12.5 MG tablet, , Disp: , Rfl:    meloxicam (MOBIC) 15 MG tablet, Take 15 mg by mouth daily as needed., Disp: , Rfl:    methotrexate (RHEUMATREX) 2.5 MG tablet, Take by mouth., Disp: , Rfl:    pantoprazole (PROTONIX) 40 MG tablet, SMARTSIG:1 Tablet(s) By Mouth Morning-Night, Disp: , Rfl:    prochlorperazine (COMPAZINE) 10 MG tablet, Take 1 tablet (10 mg total) by mouth every 8 (eight) hours as needed for nausea or vomiting., Disp: 15 tablet, Rfl: 0   sertraline (ZOLOFT) 25 MG tablet, Take 25 mg by mouth daily., Disp: , Rfl:    topiramate (TOPAMAX) 200 MG tablet, Take 200 mg by mouth  2 (two) times daily., Disp: , Rfl:    Urea 39 % CREA, Apply 1 application topically daily., Disp: 227 g, Rfl: 5   valACYclovir (VALTREX) 500 MG tablet, TAKE 1 TABLET BY MOUTH TWICE DAILY IF NEEDED FOR FLARE, Disp: , Rfl:    Wheat Dextrin (BENEFIBER PO), Take by mouth daily., Disp: , Rfl:    zolpidem (AMBIEN) 5 MG tablet, Take 5 mg by mouth at bedtime as needed for sleep., Disp: , Rfl:   Social History   Tobacco Use  Smoking Status Never  Smokeless Tobacco Never    Allergies  Allergen Reactions   Macrobid [Nitrofurantoin Macrocrystal] Shortness Of Breath   Doxycycline Swelling    "Tongue swollen"   Trazodone Nausea Only   Objective:  There were no vitals filed for this visit. Body mass index is 21.97 kg/m. Constitutional Well developed. Well nourished.  Vascular Dorsalis pedis pulses palpable bilaterally. Posterior tibial pulses palpable bilaterally. Capillary refill normal to all digits.  No cyanosis or clubbing noted. Pedal hair growth normal.  Neurologic Normal speech. Oriented to person, place, and time. Epicritic sensation to light touch grossly present bilaterally.  Dermatologic Painful ingrowing nail at lateral nail borders of the fourth nail right. No other open wounds. No skin lesions.  Orthopedic: Normal joint ROM without pain or crepitus bilaterally. No visible deformities. No bony tenderness.   Radiographs: None Assessment:   1. Ingrown nail of fourth toe of right foot    Plan:  Patient was evaluated and treated and all questions answered.  Ingrown Nail, right -Patient elects to proceed with minor surgery to remove ingrown toenail removal today. Consent reviewed and signed by patient. -Ingrown nail excised. See procedure note. -Educated on post-procedure care including soaking. Written instructions provided and reviewed. -Patient to follow up in 2 weeks for nail check.  Procedure: Excision of Ingrown Toenail Location: Right 4th toe lateral nail  borders. Anesthesia: Lidocaine 1% plain; 1.5 mL and Marcaine 0.5% plain; 1.5 mL, digital block. Skin Prep: Betadine. Dressing: Silvadene; telfa; dry, sterile, compression dressing. Technique: Following skin prep, the toe was exsanguinated and a tourniquet was secured at the base of the toe. The affected nail border was freed, split with a nail splitter, and excised. Chemical matrixectomy was then performed with phenol and irrigated out with alcohol. The tourniquet was then removed and sterile dressing applied. Disposition: Patient tolerated procedure well. Patient to return in 2 weeks for follow-up.   No follow-ups on file.

## 2023-01-26 ENCOUNTER — Ambulatory Visit: Payer: Medicare HMO | Admitting: Podiatry

## 2023-03-01 ENCOUNTER — Encounter: Payer: Self-pay | Admitting: Podiatry

## 2023-03-01 ENCOUNTER — Ambulatory Visit (INDEPENDENT_AMBULATORY_CARE_PROVIDER_SITE_OTHER): Payer: Medicare HMO | Admitting: Podiatry

## 2023-03-01 VITALS — Ht 64.0 in | Wt 128.0 lb

## 2023-03-01 DIAGNOSIS — L600 Ingrowing nail: Secondary | ICD-10-CM | POA: Diagnosis not present

## 2023-03-01 NOTE — Progress Notes (Signed)
Subjective:  Patient ID: Michelle Warren, female    DOB: 02-20-1951,  MRN: [id number]  Chief Complaint  Patient presents with   Ingrown Toenail    Pt is here to f/u on ingrown that was removed from 4th toe on the right foot. And have another ingrown removed from 2nd toe on the right foot.    72 y.o. female presents with the above complaint.  Patient presents with right second medial border ingrown painful to touch is progressive gotten worse worse with ambulation worse with pressure patient would like for me to remove it.  If she has not seen anyone as prior to seeing me for this.  She denies any other acute complaints would like it removed pain scale 7 out of 10 dull aching nature   Review of Systems: Negative except as noted in the HPI. Denies N/V/F/Ch.  Past Medical History:  Diagnosis Date   Aortic atherosclerosis (HCC)    Basal cell carcinoma 04/11/2019   L sup nasolabial lat to nasal ala. Nodular. MOHs 06/04/19   Bladder prolapse, female, acquired    Cervical disc disease    Chicken pox    Chondromalacia of left patella    Chronic kidney disease    Coronary artery disease    Dysplastic nevus 12/19/2007   Right proximal lat. dorsum finger. Slight atypia, margins involved.    Dysplastic nevus 12/19/2007   Right sup. lat. scapula. Slight to moderate atypia, close to margin.   Dysplastic nevus 07/15/2008   Left lateral breast. Slight atypia, edges free.   Dysplastic nevus 07/15/2008   Right mastoid-postauricular. Slight atypia, edges free.   Dysplastic nevus 07/15/2008   Right top of shoulder, lat. Slight atypia, edges free.   GERD (gastroesophageal reflux disease)    Headache    Hemorrhoids    Insomnia    Lumbar disc disease    Lumbar spondylosis    Meningioma (HCC)    Migraines    Rotator cuff tendinitis    Squamous cell carcinoma of skin 02/06/2020   Right glabella. WD SCC with superficial infiltration. MOHs 04/18/20   Squamous cell carcinoma of skin 05/14/2020    Right cheek (in situ), EDC   Vertigo     Current Outpatient Medications:    albuterol (VENTOLIN HFA) 108 (90 Base) MCG/ACT inhaler, Inhale into the lungs every 6 (six) hours as needed for wheezing or shortness of breath., Disp: , Rfl:    Azelastine HCl 137 MCG/SPRAY SOLN, Place into the nose 2 (two) times daily as needed., Disp: , Rfl:    baclofen (LIORESAL) 10 MG tablet, Take 10 mg by mouth as needed for muscle spasms., Disp: , Rfl:    diphenhydrAMINE (BENADRYL) 25 mg capsule, Take 25 mg by mouth every 6 (six) hours as needed., Disp: , Rfl:    docusate sodium (COLACE) 100 MG capsule, Take 100 mg by mouth 2 (two) times daily as needed for mild constipation., Disp: , Rfl:    eletriptan (RELPAX) 40 MG tablet, Take 40 mg by mouth as needed for migraine or headache. May repeat in 2 hours if headache persists or recurs., Disp: , Rfl:    Erenumab-aooe 140 MG/ML SOAJ, Inject into the skin every 28 (twenty-eight) days., Disp: , Rfl:    folic acid (FOLVITE) 1 MG tablet, Take by mouth., Disp: , Rfl:    gabapentin (NEURONTIN) 600 MG tablet, Take 600 mg by mouth at bedtime., Disp: , Rfl:    hydrocortisone (ANUSOL-HC) 2.5 % rectal cream, Place 1 application  rectally  2 (two) times daily as needed., Disp: , Rfl:    hydroxychloroquine (PLAQUENIL) 200 MG tablet, Take 200 mg by mouth 2 (two) times daily., Disp: , Rfl:    ibuprofen (ADVIL,MOTRIN) 600 MG tablet, Take 1 tablet (600 mg total) by mouth every 8 (eight) hours as needed., Disp: 30 tablet, Rfl: 0   lactulose (CHRONULAC) 10 GM/15ML solution, Take by mouth daily as needed for mild constipation., Disp: , Rfl:    meclizine (ANTIVERT) 12.5 MG tablet, , Disp: , Rfl:    meloxicam (MOBIC) 15 MG tablet, Take 15 mg by mouth daily as needed., Disp: , Rfl:    methotrexate (RHEUMATREX) 2.5 MG tablet, Take by mouth., Disp: , Rfl:    pantoprazole (PROTONIX) 40 MG tablet, SMARTSIG:1 Tablet(s) By Mouth Morning-Night, Disp: , Rfl:    prochlorperazine (COMPAZINE) 10 MG  tablet, Take 1 tablet (10 mg total) by mouth every 8 (eight) hours as needed for nausea or vomiting., Disp: 15 tablet, Rfl: 0   sertraline (ZOLOFT) 25 MG tablet, Take 25 mg by mouth daily., Disp: , Rfl:    topiramate (TOPAMAX) 200 MG tablet, Take 200 mg by mouth 2 (two) times daily., Disp: , Rfl:    Urea 39 % CREA, Apply 1 application topically daily., Disp: 227 g, Rfl: 5   valACYclovir (VALTREX) 500 MG tablet, TAKE 1 TABLET BY MOUTH TWICE DAILY IF NEEDED FOR FLARE, Disp: , Rfl:    Wheat Dextrin (BENEFIBER PO), Take by mouth daily., Disp: , Rfl:    zolpidem (AMBIEN) 5 MG tablet, Take 5 mg by mouth at bedtime as needed for sleep., Disp: , Rfl:   Social History   Tobacco Use  Smoking Status Never  Smokeless Tobacco Never    Allergies  Allergen Reactions   Macrobid [Nitrofurantoin Macrocrystal] Shortness Of Breath   Doxycycline Swelling    "Tongue swollen"   Trazodone Nausea Only   Objective:  There were no vitals filed for this visit. Body mass index is 21.97 kg/m. Constitutional Well developed. Well nourished.  Vascular Dorsalis pedis pulses palpable bilaterally. Posterior tibial pulses palpable bilaterally. Capillary refill normal to all digits.  No cyanosis or clubbing noted. Pedal hair growth normal.  Neurologic Normal speech. Oriented to person, place, and time. Epicritic sensation to light touch grossly present bilaterally.  Dermatologic Painful ingrowing nail at medial nail borders of the second nail right. No other open wounds. No skin lesions.  Orthopedic: Normal joint ROM without pain or crepitus bilaterally. No visible deformities. No bony tenderness.   Radiographs: None Assessment:   1. Ingrown nail of second toe of right foot    Plan:  Patient was evaluated and treated and all questions answered.  Ingrown Nail, right -Patient elects to proceed with minor surgery to remove ingrown toenail removal today. Consent reviewed and signed by patient. -Ingrown  nail excised. See procedure note. -Educated on post-procedure care including soaking. Written instructions provided and reviewed. -Patient to follow up in 2 weeks for nail check.  Procedure: Excision of Ingrown Toenail Location: Right 2nd toe medial nail borders. Anesthesia: Lidocaine 1% plain; 1.5 mL and Marcaine 0.5% plain; 1.5 mL, digital block. Skin Prep: Betadine. Dressing: Silvadene; telfa; dry, sterile, compression dressing. Technique: Following skin prep, the toe was exsanguinated and a tourniquet was secured at the base of the toe. The affected nail border was freed, split with a nail splitter, and excised. Chemical matrixectomy was then performed with phenol and irrigated out with alcohol. The tourniquet was then removed and sterile dressing applied.  Disposition: Patient tolerated procedure well. Patient to return in 2 weeks for follow-up.   No follow-ups on file.

## 2023-04-13 ENCOUNTER — Other Ambulatory Visit: Payer: Self-pay | Admitting: Physical Medicine and Rehabilitation

## 2023-04-13 DIAGNOSIS — M5412 Radiculopathy, cervical region: Secondary | ICD-10-CM

## 2023-04-15 ENCOUNTER — Encounter: Payer: Self-pay | Admitting: Physical Medicine and Rehabilitation

## 2023-04-19 ENCOUNTER — Ambulatory Visit: Payer: Medicare HMO | Admitting: Podiatry

## 2023-04-22 ENCOUNTER — Ambulatory Visit
Admission: RE | Admit: 2023-04-22 | Discharge: 2023-04-22 | Disposition: A | Discharge status: Home / Self Care | Payer: Medicare HMO | Source: Ambulatory Visit | Attending: Physical Medicine and Rehabilitation | Admitting: Physical Medicine and Rehabilitation

## 2023-04-22 DIAGNOSIS — M5412 Radiculopathy, cervical region: Secondary | ICD-10-CM

## 2023-06-02 ENCOUNTER — Ambulatory Visit: Payer: Medicare HMO | Admitting: Dermatology

## 2023-06-02 ENCOUNTER — Encounter: Payer: Self-pay | Admitting: Dermatology

## 2023-06-02 DIAGNOSIS — Z7189 Other specified counseling: Secondary | ICD-10-CM | POA: Diagnosis not present

## 2023-06-02 DIAGNOSIS — C4491 Basal cell carcinoma of skin, unspecified: Secondary | ICD-10-CM

## 2023-06-02 DIAGNOSIS — C44311 Basal cell carcinoma of skin of nose: Secondary | ICD-10-CM | POA: Diagnosis not present

## 2023-06-02 DIAGNOSIS — D492 Neoplasm of unspecified behavior of bone, soft tissue, and skin: Secondary | ICD-10-CM | POA: Diagnosis not present

## 2023-06-02 DIAGNOSIS — K219 Gastro-esophageal reflux disease without esophagitis: Secondary | ICD-10-CM | POA: Insufficient documentation

## 2023-06-02 DIAGNOSIS — L111 Transient acantholytic dermatosis [Grover]: Secondary | ICD-10-CM | POA: Diagnosis not present

## 2023-06-02 DIAGNOSIS — C4441 Basal cell carcinoma of skin of scalp and neck: Secondary | ICD-10-CM | POA: Diagnosis not present

## 2023-06-02 HISTORY — DX: Basal cell carcinoma of skin, unspecified: C44.91

## 2023-06-02 MED ORDER — TRIAMCINOLONE ACETONIDE 0.1 % EX CREA: 1.0000 | TOPICAL_CREAM | CUTANEOUS | 1 refills | Status: DC

## 2023-06-02 NOTE — Patient Instructions (Addendum)

## 2023-06-02 NOTE — Progress Notes (Signed)
 Follow-Up Visit   Subjective  Michelle Warren is a 73 y.o. female who presents for the following: check spots nose, hx of LN2 in past, hx of pustule, check brown spots R neck burns prn, itchy brown spots abdomen, back, chest ~31m The patient has spots, moles and lesions to be evaluated, some may be new or changing and the patient may have concern these could be cancer.   The following portions of the chart were reviewed this encounter and updated as appropriate: medications, allergies, medical history  Review of Systems:  No other skin or systemic complaints except as noted in HPI or Assessment and Plan.  Objective  Well appearing patient in no apparent distress; mood and affect are within normal limits.   A focused examination was performed of the following areas: Face,   Relevant exam findings are noted in the Assessment and Plan.  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 Your physician has selected the right strength medicine for your problem and area affected on the body. Please use your medication only as directed by your physician to prevent side effects.   NEOPLASM OF SKIN (2) nasal tip Skin / nail biopsy Type of biopsy: tangential   Informed consent: discussed and consent obtained   Timeout: patient name, date of birth, surgical site, and procedure verified   Procedure prep:  Patient was prepped and draped in usual sterile fashion Prep type:   Isopropyl alcohol Anesthesia: the lesion was anesthetized in a standard fashion   Anesthetic:  1% lidocaine w/ epinephrine 1-100,000 buffered w/ 8.4% NaHCO3 Instrument used: DermaBlade   Hemostasis achieved with: pressure and aluminum chloride   Outcome: patient tolerated procedure well   Post-procedure details: sterile dressing applied and wound care instructions given   Dressing type: bandage and bacitracin   Specimen 1 - Surgical pathology Differential Diagnosis: D48.5 R/O BCC  Check Margins: No Pink patch with telangiectasia 6.70mm Small speciman R neck Skin / nail biopsy Type of biopsy: tangential   Informed consent: discussed and consent obtained   Timeout: patient name, date of birth, surgical site, and procedure verified   Procedure prep:  Patient was prepped and draped in usual sterile fashion Prep type:  Isopropyl alcohol Anesthesia: the lesion was anesthetized in a standard fashion   Anesthetic:  1% lidocaine w/ epinephrine 1-100,000 buffered w/ 8.4% NaHCO3 Instrument used: DermaBlade   Hemostasis achieved with: pressure and aluminum chloride   Outcome: patient tolerated procedure well   Post-procedure details: sterile dressing applied and wound care instructions given   Dressing type: bandage and bacitracin   Specimen 2 - Surgical pathology Differential Diagnosis: R/O SCC vs BCC vs AK  Check Margins: No 5.57mm pink scaly maule Discussed if BCC recommend mohs, pt prefers Dr. Adriana Simas TRANSIENT ACANTHOLYTIC DERMATOSIS (GROVER)   COUNSELING AND COORDINATION OF CARE    Return for as scheduled.  I, Ardis Rowan, RMA, am acting as scribe for Elie Goody, MD .   Documentation: I have reviewed the above documentation  for accuracy and completeness, and I agree with the above.  Elie Goody, MD

## 2023-06-06 LAB — SURGICAL PATHOLOGY

## 2023-06-07 ENCOUNTER — Telehealth: Payer: Self-pay

## 2023-06-07 DIAGNOSIS — C4491 Basal cell carcinoma of skin, unspecified: Secondary | ICD-10-CM

## 2023-06-07 NOTE — Telephone Encounter (Signed)
-----   Message from [facility] sent at 06/06/2023  6:41 PM EDT ----- Diagnosis: 1. Skin, nasal tip :       BASAL CELL CARCINOMA, NODULAR PATTERN        2. Skin, right neck :       SUPERFICIAL BASAL CELL CARCINOMA    1. NASAL TIP  Biopsy: basal cell skin cancer in the second layer of the skin. This is the most common kind of skin cancer and is caused by damage from sun exposure. Basal cell skin cancers almost never spread beyond the skin, so they are not dangerous to your overall health. However, they will continue to grow, can bleed, cause nonhealing wounds, and disrupt nearby structures unless fully treated.  2. RIGHT NECK Biopsy: basal cell skin cancer in the top layer of skin  PLAN FOR BOTH  Treatment: Given the locations and types of skin cancer, I recommend Mohs surgery. Mohs surgery involves cutting out the skin cancer and then checking under the microscope to ensure the whole skin cancer was removed. If any skin cancer remains, the surgeon will cut out more until it is fully removed. The cure rate is about 98-99%. Once the Mohs surgeon confirms the skin cancer is out, they will discuss the options to repair or heal the area. You must take it easy for about two weeks after surgery (no lifting over 10-15 lbs, avoid activity to get your heart rate and blood pressure up). It is done at another office outside of [place] ([place], [place], or [place]).  FOR RIGHT NECK: add to referral that lesion recurred after treatment

## 2023-07-27 ENCOUNTER — Ambulatory Visit: Payer: Medicare HMO | Admitting: Dermatology

## 2023-08-03 ENCOUNTER — Telehealth: Payer: Self-pay

## 2023-08-03 NOTE — Telephone Encounter (Addendum)
 MOHs progress report of BCC mid tip nose and BCC of right neck sent to office. History and specimen tracking updated. aw

## 2023-08-23 ENCOUNTER — Ambulatory Visit: Admitting: Dermatology

## 2023-08-23 ENCOUNTER — Encounter: Payer: Self-pay | Admitting: Dermatology

## 2023-08-23 DIAGNOSIS — L578 Other skin changes due to chronic exposure to nonionizing radiation: Secondary | ICD-10-CM

## 2023-08-23 DIAGNOSIS — Z85828 Personal history of other malignant neoplasm of skin: Secondary | ICD-10-CM | POA: Diagnosis not present

## 2023-08-23 DIAGNOSIS — L821 Other seborrheic keratosis: Secondary | ICD-10-CM

## 2023-08-23 DIAGNOSIS — W908XXA Exposure to other nonionizing radiation, initial encounter: Secondary | ICD-10-CM | POA: Diagnosis not present

## 2023-08-23 DIAGNOSIS — L570 Actinic keratosis: Secondary | ICD-10-CM

## 2023-08-23 DIAGNOSIS — L820 Inflamed seborrheic keratosis: Secondary | ICD-10-CM

## 2023-08-23 NOTE — Patient Instructions (Addendum)
 Recommend Elta MD and Urea  40 % cream can be purchased online     Actinic keratoses are precancerous spots that appear secondary to cumulative UV radiation exposure/sun exposure over time. They are chronic with expected duration over 1 year. A portion of actinic keratoses will progress to squamous cell carcinoma of the skin. It is not possible to reliably predict which spots will progress to skin cancer and so treatment is recommended to prevent development of skin cancer.  Recommend daily broad spectrum sunscreen SPF 30+ to sun-exposed areas, reapply every 2 hours as needed.  Recommend staying in the shade or wearing long sleeves, sun glasses (UVA+UVB protection) and wide brim hats (4-inch brim around the entire circumference of the hat). Call for new or changing lesions.   Cryotherapy Aftercare  Wash gently with soap and water everyday.   Apply Vaseline and Band-Aid daily until healed.   Seborrheic Keratosis  What causes seborrheic keratoses? Seborrheic keratoses are harmless, common skin growths that first appear during adult life.  As time goes by, more growths appear.  Some people may develop a large number of them.  Seborrheic keratoses appear on both covered and uncovered body parts.  They are not caused by sunlight.  The tendency to develop seborrheic keratoses can be inherited.  They vary in color from skin-colored to gray, brown, or even black.  They can be either smooth or have a rough, warty surface.   Seborrheic keratoses are superficial and look as if they were stuck on the skin.  Under the microscope this type of keratosis looks like layers upon layers of skin.  That is why at times the top layer may seem to fall off, but the rest of the growth remains and re-grows.    Treatment Seborrheic keratoses do not need to be treated, but can easily be removed in the office.  Seborrheic keratoses often cause symptoms when they rub on clothing or jewelry.  Lesions can be in the way of  shaving.  If they become inflamed, they can cause itching, soreness, or burning.  Removal of a seborrheic keratosis can be accomplished by freezing, burning, or surgery. If any spot bleeds, scabs, or grows rapidly, please return to have it checked, as these can be an indication of a skin cancer.   Due to recent changes in healthcare laws, you may see results of your pathology and/or laboratory studies on MyChart before the doctors have had a chance to review them. We understand that in some cases there may be results that are confusing or concerning to you. Please understand that not all results are received at the same time and often the doctors may need to interpret multiple results in order to provide you with the best plan of care or course of treatment. Therefore, we ask that you please give us  2 business days to thoroughly review all your results before contacting the office for clarification. Should we see a critical lab result, you will be contacted sooner.   If You Need Anything After Your Visit  If you have any questions or concerns for your doctor, please call our main line at [phone] and press option 4 to reach your doctor's medical assistant. If no one answers, please leave a voicemail as directed and we will return your call as soon as possible. Messages left after 4 pm will be answered the following business day.   You may also send us  a message via MyChart. We typically respond to MyChart messages within 1-2 business  days.  For prescription refills, please ask your pharmacy to contact our office. Our fax number is [phone].  If you have an urgent issue when the clinic is closed that cannot wait until the next business day, you can page your doctor at the number below.    Please note that while we do our best to be available for urgent issues outside of office hours, we are not available 24/7.   If you have an urgent issue and are unable to reach us , you may choose to seek  medical care at your doctor's office, retail clinic, urgent care center, or emergency room.  If you have a medical emergency, please immediately call 911 or go to the emergency department.  Pager Numbers  - Dr. Bary Likes: [phone]  - Dr. Annette Barters: [phone]  - Dr. Felipe Horton: [phone]   In the event of inclement weather, please call our main line at [phone] for an update on the status of any delays or closures.  Dermatology Medication Tips: Please keep the boxes that topical medications come in in order to help keep track of the instructions about where and how to use these. Pharmacies typically print the medication instructions only on the boxes and not directly on the medication tubes.   If your medication is too expensive, please contact our office at [phone] option 4 or send us  a message through MyChart.   We are unable to tell what your co-pay for medications will be in advance as this is different depending on your insurance coverage. However, we may be able to find a substitute medication at lower cost or fill out paperwork to get insurance to cover a needed medication.   If a prior authorization is required to get your medication covered by your insurance company, please allow us  1-2 business days to complete this process.  Drug prices often vary depending on where the prescription is filled and some pharmacies may offer cheaper prices.  The website www.goodrx.com contains coupons for medications through different pharmacies. The prices here do not account for what the cost may be with help from insurance (it may be cheaper with your insurance), but the website can give you the price if you did not use any insurance.  - You can print the associated coupon and take it with your prescription to the pharmacy.  - You may also stop by our office during regular business hours and pick up a GoodRx coupon card.  - If you need your prescription sent electronically to a  different pharmacy, notify our office through [facility] or by phone at [phone] option 4.     Si Usted Necesita Algo Despus de Su Visita  Tambin puede enviarnos un mensaje a travs de Clinical cytogeneticist. Por lo general respondemos a los mensajes de MyChart en el transcurso de 1 a 2 das hbiles.  Para renovar recetas, por favor pida a su farmacia que se ponga en contacto con nuestra oficina. Franz Jacks de fax es [place] [phone].  Si tiene un asunto urgente cuando la clnica est cerrada y que no puede esperar hasta el siguiente da hbil, puede llamar/localizar a su doctor(a) al nmero que aparece a continuacin.   Por favor, tenga en cuenta que aunque hacemos todo lo posible para estar disponibles para asuntos urgentes fuera del horario de [place], no estamos disponibles las 24 horas del da, los 7 [address]  [address] de la [place].   Si tiene un problema urgente y no puede comunicarse con nosotros, puede optar por buscar atencin  mdica  en el consultorio de su doctor(a), en una clnica privada, en un centro de atencin urgente o en una sala de emergencias.  Si tiene Engineer, drilling, por favor llame inmediatamente al 911 o vaya a la sala de emergencias.  Nmeros de bper  - Dr. Bary Likes: [phone]  - Dra. Annette Barters: [phone]  - Dr. Felipe Horton: [phone]   En caso de inclemencias del tiempo, por favor llame a Lajuan Pila principal al [phone] para una actualizacin sobre el [place] de cualquier retraso o cierre.  Consejos para la medicacin en dermatologa: Por favor, guarde las cajas en las que vienen los medicamentos de uso tpico para ayudarle a seguir las instrucciones sobre dnde y cmo usarlos. Las farmacias generalmente imprimen las instrucciones del medicamento slo en las cajas y no directamente en los tubos del [place].   Si su medicamento es muy caro, por favor, pngase en contacto con Bettyjane Brunet llamando al [phone] y presione la opcin 4 o envenos un  mensaje a travs de Clinical cytogeneticist.   No podemos decirle cul ser su copago por los medicamentos por adelantado ya que esto es diferente dependiendo de la cobertura de su seguro. Sin embargo, es posible que podamos encontrar un medicamento sustituto a Audiological scientist un formulario para que el seguro cubra el medicamento que se considera necesario.   Si se requiere una autorizacin previa para que su compaa de seguros [place] su medicamento, por favor permtanos de 1 a 2 das hbiles para completar este proceso.  Los precios de los medicamentos varan con frecuencia dependiendo del Environmental consultant de dnde se surte la receta y alguna farmacias pueden ofrecer precios ms baratos.  El sitio web www.goodrx.com tiene cupones para medicamentos de Health and safety inspector. Los precios aqu no tienen en cuenta lo que podra costar con la ayuda del seguro (puede ser ms barato con su seguro), pero el sitio web puede darle el precio si no utiliz Tourist information centre manager.  - Puede imprimir el cupn correspondiente y llevarlo con su receta a la farmacia.  - Tambin puede pasar por nuestra oficina durante el horario de atencin regular y Education officer, museum una tarjeta de cupones de GoodRx.  - Si necesita que su receta se enve electrnicamente a una farmacia diferente, informe a nuestra oficina a travs de MyChart de [place] o por telfono llamando al [phone] y presione la opcin 4.

## 2023-08-23 NOTE — Progress Notes (Signed)
 Follow-Up Visit   Subjective  Michelle Warren is a 73 y.o. female who presents for the following:  6 month ak follow up, hx of bcc at nasal tip and right neck, reports Mohs was completed by Dr. Debrah Fan, still having reconstruction surgery on nose.  Patient reports she is having her 2nd reconstruction surgery  today and then final July 2 at nose.   The patient has spots, moles and lesions to be evaluated, some may be new or changing and the patient may have concern these could be cancer.  The following portions of the chart were reviewed this encounter and updated as appropriate: medications, allergies, medical history  Review of Systems:  No other skin or systemic complaints except as noted in HPI or Assessment and Plan.  Objective  Well appearing patient in no apparent distress; mood and affect are within normal limits.  A focused examination was performed of the following areas: Right chest, neck, face, scalp, ears, hands, arms  Relevant exam findings are noted in the Assessment and Plan.  right lower lip x 1 Erythematous thin papules/macules with gritty scale.  right chest inferior to medial clavicle x 1 Erythematous stuck-on, waxy papule or plaque  Assessment & Plan   SEBORRHEIC KERATOSIS - Stuck-on, waxy, tan-brown papules and/or plaques  - Benign-appearing - Discussed benign etiology and prognosis. - Observe - Call for any changes  ACTINIC DAMAGE - chronic, secondary to cumulative UV radiation exposure/sun exposure over time - diffuse scaly erythematous macules with underlying dyspigmentation - Recommend daily broad spectrum sunscreen SPF 30+ to sun-exposed areas, reapply every 2 hours as needed.  - Recommend staying in the shade or wearing long sleeves, sun glasses (UVA+UVB protection) and wide brim hats (4-inch brim around the entire circumference of the hat). - Call for new or changing lesions.  HISTORY OF BASAL CELL CARCINOMA OF THE SKIN 06/02/2023 Nasal tip and  right neck - Mohs by Dr. Debrah Fan - No evidence of recurrence today - Recommend regular full body skin exams - Recommend daily broad spectrum sunscreen SPF 30+ to sun-exposed areas, reapply every 2 hours as needed.  - Call if any new or changing lesions are noted between office visits  ACTINIC KERATOSIS right lower lip x 1 Patient has early ak at left forehead will hold treatment today due to Mohs reconstructive surgery - will plan treatment at next 4 month follow up pending healing   Actinic keratoses are precancerous spots that appear secondary to cumulative UV radiation exposure/sun exposure over time. They are chronic with expected duration over 1 year. A portion of actinic keratoses will progress to squamous cell carcinoma of the skin. It is not possible to reliably predict which spots will progress to skin cancer and so treatment is recommended to prevent development of skin cancer.  Recommend daily broad spectrum sunscreen SPF 30+ to sun-exposed areas, reapply every 2 hours as needed.  Recommend staying in the shade or wearing long sleeves, sun glasses (UVA+UVB protection) and wide brim hats (4-inch brim around the entire circumference of the hat). Call for new or changing lesions. Destruction of lesion - right lower lip x 1 Complexity: simple   Destruction method: cryotherapy   Informed consent: discussed and consent obtained   Timeout:  patient name, date of birth, surgical site, and procedure verified Lesion destroyed using liquid nitrogen: Yes   Region frozen until ice ball extended beyond lesion: Yes   Outcome: patient tolerated procedure well with no complications   Post-procedure details: wound care instructions  given   INFLAMED SEBORRHEIC KERATOSIS right chest inferior to medial clavicle x 1 Symptomatic, irritating, patient would like treated.   Destruction of lesion - right chest inferior to medial clavicle x 1 Complexity: simple   Destruction method: cryotherapy    Informed consent: discussed and consent obtained   Timeout:  patient name, date of birth, surgical site, and procedure verified Lesion destroyed using liquid nitrogen: Yes   Region frozen until ice ball extended beyond lesion: Yes   Outcome: patient tolerated procedure well with no complications   Post-procedure details: wound care instructions given    Return in about 4 months (around 12/23/2023) for tbse .  IRandee Busing, CMA, am acting as scribe for Celine Collard, MD.   Documentation: I have reviewed the above documentation for accuracy and completeness, and I agree with the above.  Celine Collard, MD

## 2023-11-24 ENCOUNTER — Other Ambulatory Visit: Payer: Self-pay

## 2023-11-24 DIAGNOSIS — N644 Mastodynia: Secondary | ICD-10-CM

## 2023-11-29 ENCOUNTER — Ambulatory Visit
Admission: RE | Admit: 2023-11-29 | Discharge: 2023-11-29 | Disposition: A | Discharge status: Home / Self Care | Source: Ambulatory Visit

## 2023-11-29 DIAGNOSIS — N644 Mastodynia: Secondary | ICD-10-CM

## 2023-12-27 ENCOUNTER — Ambulatory Visit: Admitting: Dermatology

## 2023-12-27 DIAGNOSIS — Z8589 Personal history of malignant neoplasm of other organs and systems: Secondary | ICD-10-CM

## 2023-12-27 DIAGNOSIS — W908XXA Exposure to other nonionizing radiation, initial encounter: Secondary | ICD-10-CM

## 2023-12-27 DIAGNOSIS — Z85828 Personal history of other malignant neoplasm of skin: Secondary | ICD-10-CM

## 2023-12-27 DIAGNOSIS — L578 Other skin changes due to chronic exposure to nonionizing radiation: Secondary | ICD-10-CM | POA: Diagnosis not present

## 2023-12-27 DIAGNOSIS — B351 Tinea unguium: Secondary | ICD-10-CM

## 2023-12-27 DIAGNOSIS — Z79899 Other long term (current) drug therapy: Secondary | ICD-10-CM

## 2023-12-27 DIAGNOSIS — L814 Other melanin hyperpigmentation: Secondary | ICD-10-CM

## 2023-12-27 DIAGNOSIS — D229 Melanocytic nevi, unspecified: Secondary | ICD-10-CM

## 2023-12-27 DIAGNOSIS — L570 Actinic keratosis: Secondary | ICD-10-CM | POA: Diagnosis not present

## 2023-12-27 DIAGNOSIS — L821 Other seborrheic keratosis: Secondary | ICD-10-CM

## 2023-12-27 DIAGNOSIS — D2272 Melanocytic nevi of left lower limb, including hip: Secondary | ICD-10-CM

## 2023-12-27 DIAGNOSIS — D225 Melanocytic nevi of trunk: Secondary | ICD-10-CM

## 2023-12-27 DIAGNOSIS — L820 Inflamed seborrheic keratosis: Secondary | ICD-10-CM | POA: Diagnosis not present

## 2023-12-27 DIAGNOSIS — D489 Neoplasm of uncertain behavior, unspecified: Secondary | ICD-10-CM | POA: Diagnosis not present

## 2023-12-27 DIAGNOSIS — Z1283 Encounter for screening for malignant neoplasm of skin: Secondary | ICD-10-CM

## 2023-12-27 DIAGNOSIS — Z86018 Personal history of other benign neoplasm: Secondary | ICD-10-CM

## 2023-12-27 DIAGNOSIS — Z7189 Other specified counseling: Secondary | ICD-10-CM

## 2023-12-27 MED ORDER — CICLOPIROX 8 % EX SOLN: CUTANEOUS | 11 refills | Status: AC

## 2023-12-27 NOTE — Progress Notes (Unsigned)
 Follow-Up Visit   Subjective  Michelle Warren is a 73 y.o. female who presents for the following: Skin Cancer Screening and Full Body Skin Exam Hx of sccis, hx of scc, hx of bcc, hx of aks, hx of isks,   The patient presents for Total-Body Skin Exam (TBSE) for skin cancer screening and mole check. The patient has spots, moles and lesions to be evaluated, some may be new or changing and the patient may have concern these could be cancer.  The following portions of the chart were reviewed this encounter and updated as appropriate: medications, allergies, medical history  Review of Systems:  No other skin or systemic complaints except as noted in HPI or Assessment and Plan.  Objective  Well appearing patient in no apparent distress; mood and affect are within normal limits.  A full examination was performed including scalp, head, eyes, ears, nose, lips, neck, chest, axillae, abdomen, back, buttocks, bilateral upper extremities, bilateral lower extremities, hands, feet, fingers, toes, fingernails, and toenails. All findings within normal limits unless otherwise noted below.   Relevant physical exam findings are noted in the Assessment and Plan.  Nevus at left anterior hip  Nevus at left anterior hip    face x 3 (3) Erythematous thin papules/macules with gritty scale.  left neck infrauricular below earlobe See photos  right mid back bra line 5 cm lateral to spine 0.6 cm irregular brown macule   face x 6 (6) Erythematous stuck-on, waxy papule or plaque  Assessment & Plan   HISTORY OF BASAL CELL CARCINOMA OF THE SKIN 06/02/2023 Nasal tip and right neck - Mohs by Dr. Bluford Completed 07/20/2023 04/11/2019 left superior nasolabial lateral to nasal ala - nodular - Mohs 06/04/2019 - No evidence of recurrence today - Recommend regular full body skin exams - Recommend daily broad spectrum sunscreen SPF 30+ to sun-exposed areas, reapply every 2 hours as needed.  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 WD SCC with superficial infiltration. Mohs 04/18/2020 - No evidence of recurrence today - No lymphadenopathy - Recommend regular full body skin exams - Recommend daily broad spectrum sunscreen SPF 30+ to sun-exposed areas, reapply every 2 hours as needed.  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  Call if any new or changing lesions are noted between office visits   SKIN CANCER SCREENING PERFORMED TODAY.  ACTINIC DAMAGE - Chronic condition, secondary to cumulative UV/sun exposure - diffuse scaly erythematous macules with underlying dyspigmentation - Recommend daily broad spectrum sunscreen SPF 30+ to sun-exposed areas, reapply every 2  hours as needed.  - Staying in the shade or wearing long sleeves, sun glasses (UVA+UVB protection) and wide brim hats (4-inch brim around the entire circumference of the hat) are also recommended for sun protection.  - Call for new or changing lesions.  LENTIGINES, SEBORRHEIC KERATOSES,  HEMANGIOMAS - Benign normal skin lesions - Benign-appearing - Call for any changes  MELANOCYTIC NEVI -  2 mm brown macule left anterior hip - see photo  Recheck at next visit will recheck at next visit  - Tan-brown and/or pink-flesh-colored symmetric macules and papules - Benign appearing on exam today - Observation - Call clinic for new or changing moles - Recommend daily use of broad spectrum spf 30+ sunscreen to sun-exposed areas.   ACTINIC KERATOSIS (4) face x 3 (3), left neck infrauricular below earlobe Discussed if area at left neck infraauricular below earlobe will recheck at next follow up if not resolved will bx   Actinic keratoses are precancerous spots that appear secondary to cumulative UV radiation exposure/sun exposure over time. They are chronic with expected duration over 1 year. A portion of actinic keratoses will progress to squamous cell carcinoma of the skin. It is not possible to reliably predict which spots will progress to skin cancer and so treatment is recommended to prevent development of skin cancer.  Recommend daily broad spectrum sunscreen SPF 30+ to sun-exposed areas, reapply every 2 hours as needed.  Recommend staying in the shade or wearing long sleeves, sun glasses (UVA+UVB protection) and wide brim hats (4-inch brim around the entire circumference of the hat). Call for new or changing lesions. Destruction of lesion - face x 3 (3), left neck infrauricular below earlobe Complexity: simple   Destruction method: cryotherapy   Informed consent: discussed and consent obtained   Timeout:  patient name, date of birth, surgical site, and procedure verified Lesion destroyed using liquid nitrogen: Yes   Region frozen until ice ball extended beyond lesion: Yes   Outcome: patient tolerated procedure well with no complications   Post-procedure details: wound care instructions given    NEOPLASM OF UNCERTAIN BEHAVIOR right mid back bra line 5 cm lateral to  spine Epidermal / dermal shaving  Lesion diameter (cm):  0.6 Informed consent: discussed and consent obtained   Timeout: patient name, date of birth, surgical site, and procedure verified   Procedure prep:  Patient was prepped and draped in usual sterile fashion Prep type:  Isopropyl alcohol Anesthesia: the lesion was anesthetized in a standard fashion   Anesthetic:  1% lidocaine  w/ epinephrine 1-100,000 buffered w/ 8.4% NaHCO3 Instrument used: flexible razor blade   Hemostasis achieved with: pressure, aluminum chloride and electrodesiccation   Outcome: patient tolerated procedure well   Post-procedure details: sterile dressing applied and wound care instructions given   Dressing type: bandage and petrolatum    Specimen 1 - Surgical pathology Differential Diagnosis: nevus r/o dysplasia  Check Margins: yes  Nevus r/o dysplasia  ONYCHOMYCOSIS   Related Medications ciclopirox  (PENLAC ) 8 % solution Apply over nail and surrounding skin. Apply daily over previous coat. After seven (7) days, may remove with alcohol and continue cycle.APPLY TO NAILS AT BEDTIME AS DIRECTED INFLAMED SEBORRHEIC KERATOSIS (6) face x 6 (6) Symptomatic, irritating, patient would like treated. Destruction of lesion - face x 6 (6) Complexity: simple   Destruction method: cryotherapy   Informed consent: discussed and consent obtained   Timeout:  patient name, date of birth, surgical site, and procedure verified Lesion destroyed using liquid nitrogen: Yes  Region frozen until ice ball extended beyond lesion: Yes   Outcome: patient tolerated procedure well with no complications   Post-procedure details: wound care instructions given    Return for january ak hx of scc, hx of bcc follow up, schedule for tbse nov.  IEleanor Warren, CMA, am acting as scribe for Alm Rhyme, MD.   Documentation: I have reviewed the above documentation for accuracy and completeness, and I agree with the above.  Alm Rhyme, MD

## 2023-12-27 NOTE — Patient Instructions (Addendum)
 Biopsy Wound Care Instructions  Leave the original bandage on for 24 hours if possible.  If the bandage becomes soaked or soiled before that time, it is OK to remove it and examine the wound.  A small amount of post-operative bleeding is normal.  If excessive bleeding occurs, remove the bandage, place gauze over the site and apply continuous pressure (no peeking) over the area for 30 minutes. If this does not work, please call our clinic as soon as possible or page your doctor if it is after hours.   Once a day, cleanse the wound with soap and water. It is fine to shower. If a thick crust develops you may use a Q-tip dipped into dilute hydrogen peroxide (mix 1:1 with water) to dissolve it.  Hydrogen peroxide can slow the healing process, so use it only as needed.    After washing, apply petroleum jelly (Vaseline) or an antibiotic ointment if your doctor prescribed one for you, followed by a bandage.    For best healing, the wound should be covered with a layer of ointment at all times. If you are not able to keep the area covered with a bandage to hold the ointment in place, this may mean re-applying the ointment several times a day.  Continue this wound care until the wound has healed and is no longer open.   Itching and mild discomfort is normal during the healing process. However, if you develop pain or severe itching, please call our office.   If you have any discomfort, you can take Tylenol (acetaminophen) or ibuprofen as directed on the bottle. (Please do not take these if you have an allergy to them or cannot take them for another reason).  Some redness, tenderness and white or yellow material in the wound is normal healing.  If the area becomes very sore and red, or develops a thick yellow-green material (pus), it may be infected; please notify us .    If you have stitches, return to clinic as directed to have the stitches removed. You will continue wound care for 2-3 days after the stitches  are removed.   Wound healing continues for up to one year following surgery. It is not unusual to experience pain in the scar from time to time during the interval.  If the pain becomes severe or the scar thickens, you should notify the office.    A slight amount of redness in a scar is expected for the first six months.  After six months, the redness will fade and the scar will soften and fade.  The color difference becomes less noticeable with time.  If there are any problems, return for a post-op surgery check at your earliest convenience.  To improve the appearance of the scar, you can use silicone scar gel, cream, or sheets (such as Mederma or Serica) every night for up to one year. These are available over the counter (without a prescription).  Please call our office at [phone] for any questions or concerns.      Cryotherapy Aftercare  Wash gently with soap and water everyday.   Apply Vaseline and Band-Aid daily until healed.   Actinic keratoses are precancerous spots that appear secondary to cumulative UV radiation exposure/sun exposure over time. They are chronic with expected duration over 1 year. A portion of actinic keratoses will progress to squamous cell carcinoma of the skin. It is not possible to reliably predict which spots will progress to skin cancer and so treatment is recommended to  prevent development of skin cancer.  Recommend daily broad spectrum sunscreen SPF 30+ to sun-exposed areas, reapply every 2 hours as needed.  Recommend staying in the shade or wearing long sleeves, sun glasses (UVA+UVB protection) and wide brim hats (4-inch brim around the entire circumference of the hat). Call for new or changing lesions.       Melanoma ABCDEs  Melanoma is the most dangerous type of skin cancer, and is the leading cause of death from skin disease.  You are more likely to develop melanoma if you: Have light-colored skin, light-colored eyes, or red or blond  hair Spend a lot of time in the sun Tan regularly, either outdoors or in a tanning bed Have had blistering sunburns, especially during childhood Have a close family member who has had a melanoma Have atypical moles or large birthmarks  Early detection of melanoma is key since treatment is typically straightforward and cure rates are extremely high if we catch it early.   The first sign of melanoma is often a change in a mole or a new dark spot.  The ABCDE system is a way of remembering the signs of melanoma.  A for asymmetry:  The two halves do not match. B for border:  The edges of the growth are irregular. C for color:  A mixture of colors are present instead of an even brown color. D for diameter:  Melanomas are usually (but not always) greater than 6mm - the size of a pencil eraser. E for evolution:  The spot keeps changing in size, shape, and color.  Please check your skin once per month between visits. You can use a small mirror in front and a large mirror behind you to keep an eye on the back side or your body.   If you see any new or changing lesions before your next follow-up, please call to schedule a visit.  Please continue daily skin protection including broad spectrum sunscreen SPF 30+ to sun-exposed areas, reapplying every 2 hours as needed when you're outdoors.   Staying in the shade or wearing long sleeves, sun glasses (UVA+UVB protection) and wide brim hats (4-inch brim around the entire circumference of the hat) are also recommended for sun protection.    Due to recent changes in healthcare laws, you may see results of your pathology and/or laboratory studies on MyChart before the doctors have had a chance to review them. We understand that in some cases there may be results that are confusing or concerning to you. Please understand that not all results are received at the same time and often the doctors may need to interpret multiple results in order to provide you with  the best plan of care or course of treatment. Therefore, we ask that you please give us  2 business days to thoroughly review all your results before contacting the office for clarification. Should we see a critical lab result, you will be contacted sooner.   If You Need Anything After Your Visit  If you have any questions or concerns for your doctor, please call our main line at [phone] and press option 4 to reach your doctor's medical assistant. If no one answers, please leave a voicemail as directed and we will return your call as soon as possible. Messages left after 4 pm will be answered the following business day.   You may also send us  a message via MyChart. We typically respond to MyChart messages within 1-2 business days.  For prescription refills, please ask  your pharmacy to contact our office. Our fax number is [phone].  If you have an urgent issue when the clinic is closed that cannot wait until the next business day, you can page your doctor at the number below.    Please note that while we do our best to be available for urgent issues outside of office hours, we are not available 24/7.   If you have an urgent issue and are unable to reach us , you may choose to seek medical care at your doctor's office, retail clinic, urgent care center, or emergency room.  If you have a medical emergency, please immediately call 911 or go to the emergency department.  Pager Numbers  - Dr. Hester: [phone]  - Dr. Jackquline: [phone]  - Dr. Claudene: [phone]   - Dr. Raymund: [phone]  In the event of inclement weather, please call our main line at [phone] for an update on the status of any delays or closures.  Dermatology Medication Tips: Please keep the boxes that topical medications come in in order to help keep track of the instructions about where and how to use these. Pharmacies typically print the medication instructions only on the boxes and not directly on  the medication tubes.   If your medication is too expensive, please contact our office at [phone] option 4 or send us  a message through MyChart.   We are unable to tell what your co-pay for medications will be in advance as this is different depending on your insurance coverage. However, we may be able to find a substitute medication at lower cost or fill out paperwork to get insurance to cover a needed medication.   If a prior authorization is required to get your medication covered by your insurance company, please allow us  1-2 business days to complete this process.  Drug prices often vary depending on where the prescription is filled and some pharmacies may offer cheaper prices.  The website www.goodrx.com contains coupons for medications through different pharmacies. The prices here do not account for what the cost may be with help from insurance (it may be cheaper with your insurance), but the website can give you the price if you did not use any insurance.  - You can print the associated coupon and take it with your prescription to the pharmacy.  - You may also stop by our office during regular business hours and pick up a GoodRx coupon card.  - If you need your prescription sent electronically to a different pharmacy, notify our office through [facility] or by phone at [phone] option 4.     Si Usted Necesita Algo Despus de Su Visita  Tambin puede enviarnos un mensaje a travs de Clinical cytogeneticist. Por lo general respondemos a los mensajes de MyChart en el transcurso de 1 a 2 das hbiles.  Para renovar recetas, por favor pida a su farmacia que se ponga en contacto con nuestra oficina. Randi lakes de fax es [place] [phone].  Si tiene un asunto urgente cuando la clnica est cerrada y que no puede esperar hasta el siguiente da hbil, puede llamar/localizar a su doctor(a) al nmero que aparece a continuacin.   Por favor, tenga en cuenta que aunque hacemos todo lo  posible para estar disponibles para asuntos urgentes fuera del horario de [place], no estamos disponibles las 24 horas del da, los 7 [address]  [address] de la [place].   Si tiene un problema urgente y no puede comunicarse con nosotros, puede optar por buscar atencin mdica  en el consultorio de su doctor(a), en una clnica privada, en un centro de atencin urgente o en una sala de emergencias.  Si tiene Engineer, drilling, por favor llame inmediatamente al 911 o vaya a la sala de emergencias.  Nmeros de bper  - Dr. Hester: [phone]  - Dra. Jackquline: [phone]  - Dr. Claudene: [phone]  - Dra. Kitts: [phone]  En caso de inclemencias del [place], por favor llame a nuestra lnea principal al [phone] para una actualizacin sobre el estado de cualquier retraso o cierre.  Consejos para la medicacin en dermatologa: Por favor, guarde las cajas en las que vienen los medicamentos de uso tpico para ayudarle a seguir las instrucciones sobre dnde y cmo usarlos. Las farmacias generalmente imprimen las instrucciones del medicamento slo en las cajas y no directamente en los tubos del [place].   Si su medicamento es muy caro, por favor, pngase en contacto con landry rieger llamando al [phone] y presione la opcin 4 o envenos un mensaje a travs de Clinical cytogeneticist.   No podemos decirle cul ser su copago por los medicamentos por adelantado ya que esto es diferente dependiendo de la cobertura de su seguro. Sin embargo, es posible que podamos encontrar un medicamento sustituto a Audiological scientist un formulario para que el seguro cubra el medicamento que se considera necesario.   Si se requiere una autorizacin previa para que su compaa de seguros malta su medicamento, por favor permtanos de 1 a 2 das hbiles para completar este proceso.  Los precios de los medicamentos varan con frecuencia dependiendo del Environmental consultant de dnde se surte la receta y alguna farmacias pueden ofrecer precios  ms baratos.  El sitio web www.goodrx.com tiene cupones para medicamentos de Health and safety inspector. Los precios aqu no tienen en cuenta lo que podra costar con la ayuda del seguro (puede ser ms barato con su seguro), pero el sitio web puede darle el precio si no utiliz Tourist information centre manager.  - Puede imprimir el cupn correspondiente y llevarlo con su receta a la farmacia.  - Tambin puede pasar por nuestra oficina durante el horario de atencin regular y Education officer, museum una tarjeta de cupones de GoodRx.  - Si necesita que su receta se enve electrnicamente a una farmacia diferente, informe a nuestra oficina a travs de MyChart de [place] o por telfono llamando al [phone] y presione la opcin 4.

## 2023-12-28 ENCOUNTER — Encounter: Payer: Self-pay | Admitting: Dermatology

## 2023-12-28 ENCOUNTER — Ambulatory Visit: Payer: Self-pay | Admitting: Dermatology

## 2023-12-28 LAB — SURGICAL PATHOLOGY

## 2023-12-28 NOTE — Telephone Encounter (Signed)
 Patient advised of BX results. aw

## 2023-12-28 NOTE — Telephone Encounter (Addendum)
 Tried calling patient regarding bx results. No answer. Lm for patient to return call ----- Message from Alm Rhyme sent at 12/28/2023  4:43 PM EDT ----- FINAL DIAGNOSIS        1. Skin, right mid back bra line 5 cm lateral to spine :       DYSPLASTIC COMPOUND NEVUS WITH MODERATE ATYPIA, LIMITED MARGINS FREE    Moderate dysplastic Recheck next visit ----- Message ----- From: Interface, Lab In [facility] One Sent: 12/28/2023   3:52 PM EDT To: Alm JAYSON Rhyme, MD

## 2024-03-12 ENCOUNTER — Telehealth: Payer: Self-pay | Admitting: Cardiovascular Disease

## 2024-03-12 NOTE — Telephone Encounter (Signed)
 Called and spoke with the patient.  Patient stated she had an episode of racing heart on this past Saturday.  Patient also states chest pain since that episode that has not yet resolved.  Patient describes the pain as muscle ache on the chest where the heart would be.  Patient denies any other symptoms.  Patient advised to go to the emergency room for any chest pain symptoms that she feels needs to be evaluated.  Patient does have an appointment scheduled to see Tylene Lunch, NP on 03/14/24.  Patient also provided verbal instructions on deep breathing and relaxing techniques. Patient states that she will go to the emergency room for any worsening symptoms that she feels need to evaluated.  For now, she will keep her appointment on the 24th.  Patient offered reassurances and patient verbalized gratitude for the call back and verbalized understanding of emergency room protocols.

## 2024-03-12 NOTE — Telephone Encounter (Signed)
" ° °  How long have you had palpitations/irregular HR/ Afib? Are you having the symptoms now? Episode happened 12/20 around 9pm   Are you currently experiencing lightheadedness, SOB or CP? No   Do you have a history of afib (atrial fibrillation) or irregular heart rhythm? No   Have you checked your BP or HR? (document readings if available): No   Are you experiencing any other symptoms? Anxiety   Pt had an episode this past Saturday around 9pm when her heart started racing. She explains it as beating so hard it was painful. Pt does not have chest pain but describes her chest as being sore ever since the episode, especially when she takes a deep breath. Pt has not been seen since 9/23, scheduled her for 12/24 with S. Hammock. Please advise.  "

## 2024-03-13 NOTE — Progress Notes (Signed)
 " Cardiology Office Note:    Date:  03/14/2024   ID:  Michelle Warren, DOB Nov 12, 1950, MRN [id number]  PCP:  Fernande Ophelia JINNY DOUGLAS, MD   [facility] Providers Cardiologist:  Deatrice Cage, MD     Referring MD: Fernande Ophelia JINNY DOUGLAS, MD   Chief complaint: Annual follow-up of palpitations     History of Present Illness:   Michelle Warren is a 73 y.o. female with a hx of palpitations, aortic atherosclerosis, GERD, CKD stage III, prediabetes, rheumatoid arthritis with negative rheumatoid factor, secondary Sjogren syndrome, presenting for follow-up of palpitations.  Initially referred to our cardiology services in September 2023 following chest pain and palpitations.  Cardiac event monitoring revealed HR 50-182, average 69 bpm, NSR, 35 runs of SVT, longest 12.4 seconds, detected within 45 seconds of symptomatic events, rare PACs and PVCs.  Coronary CTA on 12/28/2021 showed CAC score 0, no evidence of CAD, no significant extracardiac findings.  Toprol  25 mg once daily was added for SVT.  Took the med for 8 weeks until she began to feel extremely dizzy and more nauseous, she stopped taking the medicine.  Was started on diltiazem  120 mg daily, which patient stated was aggravating her neuropathy.  No further medical therapy was recommended at that time.  Patient followed up with the [facility] 1 month later for second opinion. Their note states she was tested and ruled out for vertigo.  [facility] did not recommend further medical therapy at that time, advised to continue monitoring.  Presents with her husband. Currently stable from a cardiac standpoint. Dx w/ sinus infection last week, started on prednisone. On her second night of prednisone she was watching TV at 9 pm, she had taken her nighttime sleep aid, and began feeling a fast heart rate similar to her previous episodes. This time the heart rate persisted for much longer than her previous episodes, lasting around an hour. She  developed chest pain after the tachycardia, denies dizziness/SOB/nausea/near syncope. Chest pain was substernal, left sided, sharp, throbbing, constant, described as more intense than previous episodes. States she laid down trying to feel better, but fell asleep from her Ambien and gabapentin. Was asymptomatic on waking the next day. Since then, she's had some mild soreness throughout her ribcage with deep inspiration, uncertain if this is related to her episode or previous sinus infection as she stopped taking the prednisone after her tachycardic episode. No recurrence of symptoms since stopping prednisone. Reports occasional lightheadedness w/ her migraines that resolves w/ migraine improvement. Denies SOB, DOE, orthopnea, dark/tarry/bloody stools, hematuria, weight changes, edema.    ROS:   Please see the history of present illness.    All other systems reviewed and are negative.     Past Medical History:  Diagnosis Date   Aortic atherosclerosis    Basal cell carcinoma 04/11/2019   L sup nasolabial lat to nasal ala. Nodular. MOHs 06/04/19   BCC (basal cell carcinoma of skin) 06/02/2023   nasal tip, Mohs completed 07/20/2023   BCC (basal cell carcinoma of skin) 06/02/2023   right neck, MOHs completed 07/20/2023   Bladder prolapse, female, acquired    Cervical disc disease    Chicken pox    Chondromalacia of left patella    Chronic kidney disease    Coronary artery disease    Dysplastic nevus 12/19/2007   Right proximal lat. dorsum finger. Slight atypia, margins involved.    Dysplastic nevus 12/19/2007   Right sup. lat. scapula. Slight to moderate atypia, close  to margin.   Dysplastic nevus 07/15/2008   Left lateral breast. Slight atypia, edges free.   Dysplastic nevus 07/15/2008   Right mastoid-postauricular. Slight atypia, edges free.   Dysplastic nevus 07/15/2008   Right top of shoulder, lat. Slight atypia, edges free.   Dysplastic nevus 12/27/2023   right mid back bra line 5  cm lateral to spine - moderate - recheck at next follow up   GERD (gastroesophageal reflux disease)    Headache    Hemorrhoids    Insomnia    Lumbar disc disease    Lumbar spondylosis    Meningioma (HCC)    Migraines    Rotator cuff tendinitis    Squamous cell carcinoma of skin 02/06/2020   Right glabella. WD SCC with superficial infiltration. MOHs 04/18/20   Squamous cell carcinoma of skin 05/14/2020   Right cheek (in situ), EDC   Vertigo     Past Surgical History:  Procedure Laterality Date   ABDOMINAL HYSTERECTOMY     bladder tack     COLONOSCOPY N/A 08/21/2021   Procedure: COLONOSCOPY;  Surgeon: Onita Elspeth Sharper, DO;  Location: [facility] ENDOSCOPY;  Service: Gastroenterology;  Laterality: N/A;   COLONOSCOPY WITH PROPOFOL  N/A 12/22/2016   Procedure: COLONOSCOPY WITH PROPOFOL ;  Surgeon: Toledo, Ladell POUR, MD;  Location: [facility] ENDOSCOPY;  Service: Gastroenterology;  Laterality: N/A;   COLONOSCOPY WITH PROPOFOL  N/A 03/17/2021   Procedure: COLONOSCOPY WITH PROPOFOL ;  Surgeon: Maryruth Ole DASEN, MD;  Location: [facility] ENDOSCOPY;  Service: Endoscopy;  Laterality: N/A;   COLONOSCOPY WITH PROPOFOL  N/A 08/20/2021   Procedure: COLONOSCOPY WITH PROPOFOL ;  Surgeon: Onita Elspeth Sharper, DO;  Location: [facility] ENDOSCOPY;  Service: Gastroenterology;  Laterality: N/A;   COLONOSCOPY WITH PROPOFOL  N/A 11/01/2022   Procedure: COLONOSCOPY WITH PROPOFOL ;  Surgeon: Onita Elspeth Sharper, DO;  Location: [facility] ENDOSCOPY;  Service: Gastroenterology;  Laterality: N/A;   ESOPHAGOGASTRODUODENOSCOPY     ESOPHAGOGASTRODUODENOSCOPY (EGD) WITH PROPOFOL  N/A 03/17/2021   Procedure: ESOPHAGOGASTRODUODENOSCOPY (EGD) WITH PROPOFOL ;  Surgeon: Maryruth Ole DASEN, MD;  Location: [facility] ENDOSCOPY;  Service: Endoscopy;  Laterality: N/A;   ESOPHAGOGASTRODUODENOSCOPY (EGD) WITH PROPOFOL  N/A 08/20/2021   Procedure: ESOPHAGOGASTRODUODENOSCOPY (EGD) WITH PROPOFOL ;  Surgeon: Onita Elspeth Sharper, DO;  Location: [facility] ENDOSCOPY;  Service:  Gastroenterology;  Laterality: N/A;   repair enterocele vaginal     SALPINGECTOMY Bilateral    SINUS EXPLORATION     TUBAL LIGATION      Current Medications: Active Medications[1]   Allergies:   Macrobid [nitrofurantoin macrocrystal], Doxycycline, and Trazodone   Social History   Socioeconomic History   Marital status: Married    Spouse name: Not on file   Number of children: Not on file   Years of education: Not on file   Highest education level: Not on file  Occupational History   Not on file  Tobacco Use   Smoking status: Never   Smokeless tobacco: Never  Vaping Use   Vaping status: Never Used  Substance and Sexual Activity   Alcohol use: No   Drug use: No   Sexual activity: Yes  Other Topics Concern   Not on file  Social History Narrative   Not on file   Social Drivers of Health   Tobacco Use: Low Risk (03/14/2024)   Patient History    Smoking Tobacco Use: Never    Smokeless Tobacco Use: Never    Passive Exposure: Not on file  Financial Resource Strain: Low Risk  (01/04/2023)   Received from [facility]   Overall Financial Resource  Strain (CARDIA)    Difficulty of Paying Living Expenses: Not hard at all  Food Insecurity: No Food Insecurity (01/04/2023)   Received from [facility]   Epic    Within the past 12 months, the food you bought just didn't last and you didn't have money to get more.: Never true    Within the past 12 months, you worried that your food would run out before you got the money to buy more.: Never true  Transportation Needs: No Transportation Needs (01/04/2023)   Received from [facility]   PRAPARE - Transportation    Lack of Transportation (Non-Medical): No    In the past 12 months, has lack of transportation kept you from medical appointments or from getting medications?: No  Physical Activity: Not on file  Stress: Not on file  Social Connections: Not on file  Depression  (EYV7-0): Not on file  Alcohol Screen: Not on file  Housing: Unknown (04/05/2023)   Received from [facility]   Epic    In the last 12 months, was there a time when you were not able to pay the mortgage or rent on time?: No    Number of Times Moved in the Last Year: Not on file    At any time in the past 12 months, were you homeless or living in a shelter (including now)?: No  Utilities: Not At Risk (01/04/2023)   Received from [facility] Utilities    Threatened with loss of utilities: No  Health Literacy: Not on file     Family History: The patient's family history includes CAD in her mother; Glaucoma in her maternal grandmother; Heart attack in her mother; Migraines in her sister; Stroke in her mother; Thyroid disease in her sister. There is no history of Breast cancer.  EKGs/Labs/Other Studies Reviewed:    The following studies were reviewed today:  EKG Interpretation Date/Time:  Wednesday March 14 2024 09:36:01 EST Ventricular Rate:  63 PR Interval:  128 QRS Duration:  78 QT Interval:  412 QTC Calculation: 421 R Axis:   67  Text Interpretation: Normal sinus rhythm Normal ECG Confirmed by Aitanna Haubner [phone]) on 03/14/2024 9:42:55 AM    Recent Labs: No results found for requested labs within last 365 days.  Recent Lipid Panel No results found for: CHOL, TRIG, HDL, CHOLHDL, VLDL, LDLCALC, LDLDIRECT         Physical Exam:    VS:  BP 120/68 (BP Location: Left Arm, Patient Position: Sitting, Cuff Size: Normal)   Pulse 63 Comment: 67 oximeter  Ht 5' 4 (1.626 m)   Wt 130 lb 9.6 oz (59.2 kg)   SpO2 99%   BMI 22.42 kg/m        Wt Readings from Last 3 Encounters:  03/14/24 130 lb 9.6 oz (59.2 kg)  03/01/23 128 lb (58.1 kg)  01/25/23 128 lb (58.1 kg)     GEN:  Well nourished, well developed in no acute distress HEENT: Normal NECK:  No carotid bruits CARDIAC:  S1-S2 normal, RRR, no murmurs, rubs,  gallops RESPIRATORY:  Clear to auscultation without rales, wheezing or rhonchi  MUSCULOSKELETAL:  Trace bilateral LE edema; No deformity  SKIN: Warm and dry NEUROLOGIC:  Alert and oriented x 3 PSYCHIATRIC:  Normal affect       Assessment & Plan Tachycardia Palpitations Chest pain, unspecified type EKG today NSR, 63 bpm, no ectopy Episode reported as similar to historical episodes with  more intense chest pain and longer duration of tachycardia. Prednisone likely exacerbating underlying known SVT, symptoms resolved on prednisone cessation. Will order 1 week zio monitoring to evaluate ectopy burden. Offered PRN propranolol for future episodes, patient wanted to wait to see if it recurred following prednisone cessation. Offered to update BMP, CBC, Mag, TSH, patient requested to have labs drawn w/ PCP. Mag and TSH have historically been WNL when drawn at outside facilities, do not suspect they are contributing to symptoms. Patient instructed if she experiences similar episodes of symptomatic tachycardia going forward she is to proceed to the ED. If this continues, would likely advise PRN low dose beta blocker. Do not believe symptoms are ischemic in nature given reassuring CCTA in 2023. Could consider echo to rule out valvular or structural cause if symptoms persist.   Disposition: Follow up in 3 months or sooner if needed. Proceed to ED if symptoms worsen or change.         Medication Adjustments/Labs and Tests Ordered: Current medicines are reviewed at length with the patient today.  Concerns regarding medicines are outlined above.  Orders Placed This Encounter  Procedures   LONG TERM MONITOR (3-14 DAYS)   EKG 12-Lead   No orders of the defined types were placed in this encounter.   Patient Instructions  Medication Instructions:  Your physician recommends that you continue on your current medications as directed. Please refer to the Current Medication list given to you today.  *If  you need a refill on your cardiac medications before your next appointment, please call your pharmacy*  Lab Work: No labs ordered today , Have labs done with your PCP  If you have labs (blood work) drawn today and your tests are completely normal, you will receive your results only by: MyChart Message (if you have MyChart) OR A paper copy in the mail If you have any lab test that is abnormal or we need to change your treatment, we will call you to review the results.  Testing/Procedures: Your physician has recommended that you wear a Zio monitor.   This monitor is a medical device that records the hearts electrical activity. Doctors most often use these monitors to diagnose arrhythmias. Arrhythmias are problems with the speed or rhythm of the heartbeat. The monitor is a small device applied to your chest. You can wear one while you do your normal daily activities. While wearing this monitor if you have any symptoms to push the button and record what you felt. Once you have worn this monitor for the period of time provider prescribed (Usually 14 days), you will return the monitor device in the postage paid box. Once it is returned they will download the data collected and provide us  with a report which the provider will then review and we will call you with those results. Important tips:  Avoid showering during the first 24 hours of wearing the monitor. Avoid excessive sweating to help maximize wear time. Do not submerge the device, no hot tubs, and no swimming pools. Keep any lotions or oils away from the patch. After 24 hours you may shower with the patch on. Take brief showers with your back facing the shower head.  Do not remove patch once it has been placed because that will interrupt data and decrease adhesive wear time. Push the button when you have any symptoms and write down what you were feeling. Once you have completed wearing your monitor, remove and place into box which has postage  paid  and place in your outgoing mailbox.  If for some reason you have misplaced your box then call our office and we can provide another box and/or mail it off for you.     Follow-Up: At [facility], you and your health needs are our priority.  As part of our continuing mission to provide you with exceptional heart care, our providers are all part of one team.  This team includes your primary Cardiologist (physician) and Advanced Practice Providers or APPs (Physician Assistants and Nurse Practitioners) who all work together to provide you with the care you need, when you need it.  Your next appointment:   3 month follow up  Provider:   You may see Deatrice Cage, MD or one of the following Advanced Practice Providers on your designated Care Team:   Tylene Lunch, NP  We recommend signing up for the patient portal called MyChart.  Sign up information is provided on this After Visit Summary.  MyChart is used to connect with patients for Virtual Visits (Telemedicine).  Patients are able to view lab/test results, encounter notes, upcoming appointments, etc.  Non-urgent messages can be sent to your provider as well.   To learn more about what you can do with MyChart, go to [email or web address].             Signed, Miriam FORBES Shams, NP  03/14/2024 10:38 AM    [facility]     [1]  Current Meds  Medication Sig   albuterol (VENTOLIN HFA) 108 (90 Base) MCG/ACT inhaler Inhale into the lungs every 6 (six) hours as needed for wheezing or shortness of breath.   baclofen (LIORESAL) 10 MG tablet Take 10 mg by mouth as needed for muscle spasms.   ciclopirox  (PENLAC ) 8 % solution Apply over nail and surrounding skin. Apply daily over previous coat. After seven (7) days, may remove with alcohol and continue cycle.APPLY TO NAILS AT BEDTIME AS DIRECTED   docusate sodium (COLACE) 100 MG capsule Take 100 mg by mouth 2 (two) times daily as needed for mild constipation.    eletriptan (RELPAX) 40 MG tablet Take 40 mg by mouth as needed for migraine or headache. May repeat in 2 hours if headache persists or recurs.   gabapentin (NEURONTIN) 600 MG tablet Take 600 mg by mouth at bedtime.   lactulose (CHRONULAC) 10 GM/15ML solution Take by mouth daily as needed for mild constipation.   meloxicam (MOBIC) 15 MG tablet Take 15 mg by mouth daily as needed.   pantoprazole (PROTONIX) 40 MG tablet SMARTSIG:1 Tablet(s) By Mouth Morning-Night   sertraline (ZOLOFT) 25 MG tablet Take 25 mg by mouth daily.   topiramate (TOPAMAX) 200 MG tablet Take 200 mg by mouth 2 (two) times daily.   Urea  39 % CREA Apply 1 application topically daily.   valACYclovir (VALTREX) 500 MG tablet TAKE 1 TABLET BY MOUTH TWICE DAILY IF NEEDED FOR FLARE   zolpidem (AMBIEN) 5 MG tablet Take 5 mg by mouth at bedtime as needed for sleep.   [DISCONTINUED] Azelastine HCl 137 MCG/SPRAY SOLN Place into the nose 2 (two) times daily as needed.   [DISCONTINUED] diphenhydrAMINE (BENADRYL) 25 mg capsule Take 25 mg by mouth every 6 (six) hours as needed.   [DISCONTINUED] Erenumab-aooe 140 MG/ML SOAJ Inject into the skin every 28 (twenty-eight) days.   [DISCONTINUED] hydrocortisone (ANUSOL-HC) 2.5 % rectal cream Place 1 application  rectally 2 (two) times daily as needed.   [DISCONTINUED] hydroxychloroquine (PLAQUENIL) 200 MG tablet Take 200 mg  by mouth 2 (two) times daily.   [DISCONTINUED] ibuprofen  (ADVIL ,MOTRIN ) 600 MG tablet Take 1 tablet (600 mg total) by mouth every 8 (eight) hours as needed.   [DISCONTINUED] meclizine (ANTIVERT) 12.5 MG tablet    [DISCONTINUED] prochlorperazine  (COMPAZINE ) 10 MG tablet Take 1 tablet (10 mg total) by mouth every 8 (eight) hours as needed for nausea or vomiting.   [DISCONTINUED] triamcinolone  cream (KENALOG ) 0.1 % Apply 1 Application topically as directed. Bid up to 2 weeks to aa itchy rash on body, avoid face, groin, axilla   [DISCONTINUED] Wheat Dextrin (BENEFIBER PO) Take by  mouth daily.   "

## 2024-03-14 ENCOUNTER — Encounter: Payer: Self-pay | Admitting: Cardiology

## 2024-03-14 ENCOUNTER — Ambulatory Visit: Attending: Cardiology | Admitting: Emergency Medicine

## 2024-03-14 VITALS — BP 120/68 | HR 63 | Ht 64.0 in | Wt 130.6 lb

## 2024-03-14 DIAGNOSIS — R079 Chest pain, unspecified: Secondary | ICD-10-CM | POA: Diagnosis not present

## 2024-03-14 DIAGNOSIS — R000 Tachycardia, unspecified: Secondary | ICD-10-CM | POA: Diagnosis not present

## 2024-03-14 DIAGNOSIS — R002 Palpitations: Secondary | ICD-10-CM

## 2024-03-14 NOTE — Patient Instructions (Addendum)
 Medication Instructions:  Your physician recommends that you continue on your current medications as directed. Please refer to the Current Medication list given to you today.  *If you need a refill on your cardiac medications before your next appointment, please call your pharmacy*  Lab Work: No labs ordered today , Have labs done with your PCP  If you have labs (blood work) drawn today and your tests are completely normal, you will receive your results only by: MyChart Message (if you have MyChart) OR A paper copy in the mail If you have any lab test that is abnormal or we need to change your treatment, we will call you to review the results.  Testing/Procedures: Your physician has recommended that you wear a Zio monitor.   This monitor is a medical device that records the hearts electrical activity. Doctors most often use these monitors to diagnose arrhythmias. Arrhythmias are problems with the speed or rhythm of the heartbeat. The monitor is a small device applied to your chest. You can wear one while you do your normal daily activities. While wearing this monitor if you have any symptoms to push the button and record what you felt. Once you have worn this monitor for the period of time provider prescribed (Usually 14 days), you will return the monitor device in the postage paid box. Once it is returned they will download the data collected and provide us  with a report which the provider will then review and we will call you with those results. Important tips:  Avoid showering during the first 24 hours of wearing the monitor. Avoid excessive sweating to help maximize wear time. Do not submerge the device, no hot tubs, and no swimming pools. Keep any lotions or oils away from the patch. After 24 hours you may shower with the patch on. Take brief showers with your back facing the shower head.  Do not remove patch once it has been placed because that will interrupt data and decrease adhesive  wear time. Push the button when you have any symptoms and write down what you were feeling. Once you have completed wearing your monitor, remove and place into box which has postage paid and place in your outgoing mailbox.  If for some reason you have misplaced your box then call our office and we can provide another box and/or mail it off for you.     Follow-Up: At [facility], you and your health needs are our priority.  As part of our continuing mission to provide you with exceptional heart care, our providers are all part of one team.  This team includes your primary Cardiologist (physician) and Advanced Practice Providers or APPs (Physician Assistants and Nurse Practitioners) who all work together to provide you with the care you need, when you need it.  Your next appointment:   3 month follow up  Provider:   You may see Deatrice Cage, MD or one of the following Advanced Practice Providers on your designated Care Team:   Tylene Lunch, NP  We recommend signing up for the patient portal called MyChart.  Sign up information is provided on this After Visit Summary.  MyChart is used to connect with patients for Virtual Visits (Telemedicine).  Patients are able to view lab/test results, encounter notes, upcoming appointments, etc.  Non-urgent messages can be sent to your provider as well.   To learn more about what you can do with MyChart, go to [email or web address].

## 2024-04-24 ENCOUNTER — Encounter: Payer: Self-pay | Admitting: Dermatology

## 2024-04-24 ENCOUNTER — Ambulatory Visit: Admitting: Dermatology

## 2024-04-24 DIAGNOSIS — Z85828 Personal history of other malignant neoplasm of skin: Secondary | ICD-10-CM

## 2024-04-24 DIAGNOSIS — L570 Actinic keratosis: Secondary | ICD-10-CM

## 2024-04-24 DIAGNOSIS — Z86018 Personal history of other benign neoplasm: Secondary | ICD-10-CM

## 2024-04-24 DIAGNOSIS — L578 Other skin changes due to chronic exposure to nonionizing radiation: Secondary | ICD-10-CM

## 2024-04-24 DIAGNOSIS — Z8589 Personal history of malignant neoplasm of other organs and systems: Secondary | ICD-10-CM

## 2024-04-24 DIAGNOSIS — D492 Neoplasm of unspecified behavior of bone, soft tissue, and skin: Secondary | ICD-10-CM

## 2024-04-24 DIAGNOSIS — L820 Inflamed seborrheic keratosis: Secondary | ICD-10-CM

## 2024-04-24 DIAGNOSIS — L821 Other seborrheic keratosis: Secondary | ICD-10-CM

## 2024-04-24 NOTE — Progress Notes (Unsigned)
 "  Follow-Up Visit   Subjective  Michelle Warren is a 74 y.o. female who presents for the following: AK 85m f/u, face. Check spot on mid-back near bra-line, check spots trunk, irritating   The following portions of the chart were reviewed this encounter and updated as appropriate: medications, allergies, medical history  Review of Systems:  No other skin or systemic complaints except as noted in HPI or Assessment and Plan.  Objective  Well appearing patient in no apparent distress; mood and affect are within normal limits.  A focused examination was performed of the following areas: Face, back, abdomen  Relevant exam findings are noted in the Assessment and Plan.  R mid back paraspinal 0.4cm keratotic pap  R nasal bridge x 1, R cheek x 1 (2) Stuck on waxy paps with erythema R lat nasal tip x 1 Pink scaly macules  Assessment & Plan   HISTORY OF DYSPLASTIC NEVUS No evidence of recurrence today Recommend regular full body skin exams Recommend daily broad spectrum sunscreen SPF 30+ to sun-exposed areas, reapply every 2 hours as needed.  Call if any new or changing lesions are noted between office visits  - R mid back bra line 5.0cm lat to spine  SEBORRHEIC KERATOSIS trunk - Stuck-on, waxy, tan-brown papules and/or plaques  - Benign-appearing - Discussed benign etiology and prognosis. - Observe - Call for any changes  ACTINIC DAMAGE - chronic, secondary to cumulative UV radiation exposure/sun exposure over time - diffuse scaly erythematous macules with underlying dyspigmentation - Recommend daily broad spectrum sunscreen SPF 30+ to sun-exposed areas, reapply every 2 hours as needed.  - Recommend staying in the shade or wearing long sleeves, sun glasses (UVA+UVB protection) and wide brim hats (4-inch brim around the entire circumference of the hat). - Call for new or changing lesions.    NEOPLASM OF SKIN R mid back paraspinal - Epidermal / dermal shaving  Lesion  diameter (cm):  0.4 Informed consent: discussed and consent obtained   Timeout: patient name, date of birth, surgical site, and procedure verified   Procedure prep:  Patient was prepped and draped in usual sterile fashion Prep type:  Isopropyl alcohol Anesthesia: the lesion was anesthetized in a standard fashion   Anesthetic:  1% lidocaine  w/ epinephrine 1-100,000 buffered w/ 8.4% NaHCO3 Instrument used: flexible razor blade   Hemostasis achieved with: pressure, aluminum chloride and electrodesiccation   Outcome: patient tolerated procedure well   Post-procedure details: sterile dressing applied and wound care instructions given   Dressing type: bandage and petrolatum    - Destruction of lesion Complexity: extensive   Destruction method: electrodesiccation and curettage   Informed consent: discussed and consent obtained   Timeout:  patient name, date of birth, surgical site, and procedure verified Procedure prep:  Patient was prepped and draped in usual sterile fashion Prep type:  Isopropyl alcohol Anesthesia: the lesion was anesthetized in a standard fashion   Anesthetic:  1% lidocaine  w/ epinephrine 1-100,000 buffered w/ 8.4% NaHCO3 Curettage performed in three different directions: Yes   Electrodesiccation performed over the curetted area: Yes   Lesion length (cm):  0.4 Lesion width (cm):  0.4 Margin per side (cm):  0.2 Final wound size (cm):  0.8 Hemostasis achieved with:  pressure, aluminum chloride and electrodesiccation Outcome: patient tolerated procedure well with no complications   Post-procedure details: sterile dressing applied and wound care instructions given   Dressing type: bandage and petrolatum    Specimen 1 - Surgical pathology Differential Diagnosis: SK r/o Atypia  Check Margins: yes 0.4cm keratotic pap EDC INFLAMED SEBORRHEIC KERATOSIS (2) R nasal bridge x 1, R cheek x 1 (2) Symptomatic, irritating, patient would like treated. - Destruction of lesion - R  nasal bridge x 1, R cheek x 1 (2) Complexity: simple   Destruction method: cryotherapy   Informed consent: discussed and consent obtained   Timeout:  patient name, date of birth, surgical site, and procedure verified Lesion destroyed using liquid nitrogen: Yes   Region frozen until ice ball extended beyond lesion: Yes   Outcome: patient tolerated procedure well with no complications   Post-procedure details: wound care instructions given    AK (ACTINIC KERATOSIS) R lat nasal tip x 1 Actinic keratoses are precancerous spots that appear secondary to cumulative UV radiation exposure/sun exposure over time. They are chronic with expected duration over 1 year. A portion of actinic keratoses will progress to squamous cell carcinoma of the skin. It is not possible to reliably predict which spots will progress to skin cancer and so treatment is recommended to prevent development of skin cancer.  Recommend daily broad spectrum sunscreen SPF 30+ to sun-exposed areas, reapply every 2 hours as needed.  Recommend staying in the shade or wearing long sleeves, sun glasses (UVA+UVB protection) and wide brim hats (4-inch brim around the entire circumference of the hat). Call for new or changing lesions. - Destruction of lesion - R lat nasal tip x 1 Complexity: simple   Destruction method: cryotherapy   Informed consent: discussed and consent obtained   Timeout:  patient name, date of birth, surgical site, and procedure verified Lesion destroyed using liquid nitrogen: Yes   Region frozen until ice ball extended beyond lesion: Yes   Outcome: patient tolerated procedure well with no complications   Post-procedure details: wound care instructions given     Return in 6 months (on 10/22/2024) for TBSE, Hx of BCC, Hx of SCC, Hx of Dysplastic nevi, Hx of AKs.  I, Grayce Saunas, RMA, am acting as scribe for Alm Rhyme, MD .   Documentation: I have reviewed the above documentation for accuracy and  completeness, and I agree with the above.  Alm Rhyme, MD    "

## 2024-04-24 NOTE — Patient Instructions (Addendum)

## 2024-04-25 LAB — SURGICAL PATHOLOGY

## 2024-04-26 ENCOUNTER — Ambulatory Visit: Payer: Self-pay | Admitting: Dermatology

## 2024-04-26 NOTE — Telephone Encounter (Signed)
 Discussed biopsy results with patient

## 2024-04-26 NOTE — Telephone Encounter (Signed)
-----   Message from Alm Rhyme, MD sent at 04/26/2024 11:14 AM EST ----- FINAL DIAGNOSIS        1. Skin, R mid back paraspinal :       MELANOCYTIC NEVUS, TRAUMATIZED, DEEP MARGIN INVOLVED   Benign traumatized mole No further treatment needed

## 2024-04-26 NOTE — Telephone Encounter (Signed)
 Left pt msg to call for bx results/sh

## 2024-06-14 ENCOUNTER — Ambulatory Visit: Admitting: Cardiology

## 2024-11-06 ENCOUNTER — Ambulatory Visit: Admitting: Dermatology

## 2025-01-22 ENCOUNTER — Ambulatory Visit: Admitting: Dermatology
